# Patient Record
Sex: Female | Born: 2000 | Race: White | Hispanic: No | Marital: Single | Smoking: Former smoker
Health system: Southern US, Community
[De-identification: ages and names within clinical notes are randomized; demographics above are authoritative.]

## PROBLEM LIST (undated history)

## (undated) ENCOUNTER — Emergency Department (HOSPITAL_COMMUNITY): Payer: Medicaid Other

## (undated) ENCOUNTER — Emergency Department: Payer: Medicaid Other

## (undated) DIAGNOSIS — N200 Calculus of kidney: Secondary | ICD-10-CM

## (undated) HISTORY — PX: TONSILLECTOMY: SUR1361

---

## 2018-10-12 ENCOUNTER — Inpatient Hospital Stay (HOSPITAL_COMMUNITY)
Admission: AD | Admit: 2018-10-12 | Discharge: 2018-10-12 | Disposition: A | Discharge status: Home / Self Care | Payer: Self-pay | Source: Ambulatory Visit | Attending: Obstetrics & Gynecology | Admitting: Obstetrics & Gynecology

## 2018-10-12 ENCOUNTER — Other Ambulatory Visit: Payer: Self-pay

## 2018-10-12 ENCOUNTER — Encounter (HOSPITAL_COMMUNITY): Payer: Self-pay | Admitting: Emergency Medicine

## 2018-10-12 DIAGNOSIS — Z91030 Bee allergy status: Secondary | ICD-10-CM | POA: Insufficient documentation

## 2018-10-12 DIAGNOSIS — Z888 Allergy status to other drugs, medicaments and biological substances status: Secondary | ICD-10-CM | POA: Insufficient documentation

## 2018-10-12 DIAGNOSIS — R102 Pelvic and perineal pain: Secondary | ICD-10-CM

## 2018-10-12 DIAGNOSIS — B9689 Other specified bacterial agents as the cause of diseases classified elsewhere: Secondary | ICD-10-CM

## 2018-10-12 DIAGNOSIS — N760 Acute vaginitis: Secondary | ICD-10-CM | POA: Insufficient documentation

## 2018-10-12 DIAGNOSIS — Z87442 Personal history of urinary calculi: Secondary | ICD-10-CM | POA: Insufficient documentation

## 2018-10-12 DIAGNOSIS — F17210 Nicotine dependence, cigarettes, uncomplicated: Secondary | ICD-10-CM | POA: Insufficient documentation

## 2018-10-12 HISTORY — DX: Calculus of kidney: N20.0

## 2018-10-12 LAB — URINALYSIS, ROUTINE W REFLEX MICROSCOPIC
Bilirubin Urine: NEGATIVE
Glucose, UA: NEGATIVE mg/dL
Hgb urine dipstick: NEGATIVE
Ketones, ur: NEGATIVE mg/dL
Leukocytes, UA: NEGATIVE
Nitrite: NEGATIVE
Protein, ur: NEGATIVE mg/dL
Specific Gravity, Urine: 1.008 (ref 1.005–1.030)
pH: 5 (ref 5.0–8.0)

## 2018-10-12 LAB — CBC WITH DIFFERENTIAL/PLATELET
Basophils Absolute: 0 10*3/uL (ref 0.0–0.1)
Basophils Relative: 0 %
Eosinophils Absolute: 0.1 10*3/uL (ref 0.0–0.5)
Eosinophils Relative: 1 %
HCT: 39.9 % (ref 36.0–46.0)
Hemoglobin: 13.3 g/dL (ref 12.0–15.0)
LYMPHS ABS: 2.3 10*3/uL (ref 0.7–4.0)
Lymphocytes Relative: 29 %
MCH: 29.5 pg (ref 26.0–34.0)
MCHC: 33.3 g/dL (ref 30.0–36.0)
MCV: 88.5 fL (ref 80.0–100.0)
MONO ABS: 0.3 10*3/uL (ref 0.1–1.0)
Monocytes Relative: 3 %
Neutro Abs: 5.3 10*3/uL (ref 1.7–7.7)
Neutrophils Relative %: 67 %
Platelets: 261 10*3/uL (ref 150–400)
RBC: 4.51 MIL/uL (ref 3.87–5.11)
RDW: 12.6 % (ref 11.5–15.5)
WBC: 8 10*3/uL (ref 4.0–10.5)
nRBC: 0 % (ref 0.0–0.2)

## 2018-10-12 LAB — POCT PREGNANCY, URINE: PREG TEST UR: NEGATIVE

## 2018-10-12 LAB — WET PREP, GENITAL
Sperm: NONE SEEN
Trich, Wet Prep: NONE SEEN
WBC, Wet Prep HPF POC: NONE SEEN
Yeast Wet Prep HPF POC: NONE SEEN

## 2018-10-12 MED ORDER — NORGESTIMATE-ETH ESTRADIOL 0.25-35 MG-MCG PO TABS: 1.0000 | ORAL_TABLET | Freq: Every day | ORAL | 3 refills | Status: DC

## 2018-10-12 MED ORDER — CELECOXIB 200 MG PO CAPS: ORAL_CAPSULE | ORAL | 0 refills | Status: DC

## 2018-10-12 MED ORDER — KETOROLAC TROMETHAMINE 60 MG/2ML IM SOLN
60.0000 mg | Freq: Once | INTRAMUSCULAR | Status: AC
  Administered 2018-10-12: 60 mg via INTRAMUSCULAR
  Filled 2018-10-12: qty 2

## 2018-10-12 MED ORDER — METRONIDAZOLE 500 MG PO TABS: 500.0000 mg | ORAL_TABLET | Freq: Two times a day (BID) | ORAL | 0 refills | Status: DC

## 2018-10-12 NOTE — MAU Provider Note (Signed)
History     CSN: [id number]  Arrival date and time: 10/12/18 1223   First Provider Initiated Contact with Patient 10/12/18 1315      Chief Complaint  Patient presents with  . Abdominal Pain   HPI   [name] is a 17 y.o.female here with chronic pelvic pain. Says the pain started 1 year ago. The pain when it started was every other day or every few days and now the pain is every single day. The pain is sometimes worse when her period is going to end. This morning the pain was worse. She has not taken anything for pain today. She usually takes ibuprofen which used to help, but now it does not work. She does have regular periods. Her last period was 09/29/18. She is not on birth control or any daily medications. Has not been told what this could be and has not seen a GYN or PCP for this problem.. She had [facility] and they told her it looked like something was pushing her uterus out of the way.   OB History   None     Past Medical History:  Diagnosis Date  . Kidney stones     History reviewed. No pertinent surgical history.  Family History  Problem Relation Age of Onset  . Cancer Father   . Cancer Maternal Grandmother     Social History   Tobacco Use  . Smoking status: Current Every Day Smoker    Packs/day: 0.25    Types: Cigarettes  . Smokeless tobacco: Never Used  Substance Use Topics  . Alcohol use: Yes    Frequency: Never  . Drug use: Never    Allergies:  Allergies  Allergen Reactions  . Amoxapine And Related Anaphylaxis  . Bee Venom Hives    No medications prior to admission.   Results for orders placed or performed during the hospital encounter of 10/12/18 (from the past 48 hour(s))  Urinalysis, Routine w reflex microscopic     Status: Abnormal   Collection Time: 10/12/18 12:47 PM  Result Value Ref Range   Color, Urine STRAW (A) YELLOW   APPearance HAZY (A) CLEAR   Specific Gravity, Urine 1.008 1.005 - 1.030   pH  5.0 5.0 - 8.0   Glucose, UA NEGATIVE NEGATIVE mg/dL   Hgb urine dipstick NEGATIVE NEGATIVE   Bilirubin Urine NEGATIVE NEGATIVE   Ketones, ur NEGATIVE NEGATIVE mg/dL   Protein, ur NEGATIVE NEGATIVE mg/dL   Nitrite NEGATIVE NEGATIVE   Leukocytes, UA NEGATIVE NEGATIVE    Comment: Performed at [facility], [address] [place], [place] [id number]  Pregnancy, urine POC     Status: None   Collection Time: 10/12/18 12:52 PM  Result Value Ref Range   Preg Test, Ur NEGATIVE NEGATIVE    Comment:        THE SENSITIVITY OF THIS METHODOLOGY IS >24 mIU/mL   CBC with Differential     Status: None   Collection Time: 10/12/18  1:52 PM  Result Value Ref Range   WBC 8.0 4.0 - 10.5 K/uL   RBC 4.51 3.87 - 5.11 MIL/uL   Hemoglobin 13.3 12.0 - 15.0 g/dL   HCT 19.139.9 47.836.0 - 29.546.0 %   MCV 88.5 80.0 - 100.0 fL   MCH 29.5 26.0 - 34.0 pg   MCHC 33.3 30.0 - 36.0 g/dL   RDW 62.112.6 30.811.5 - 65.715.5 %   Platelets 261 150 - 400 K/uL   nRBC 0.0  0.0 - 0.2 %   Neutrophils Relative % 67 %   Neutro Abs 5.3 1.7 - 7.7 K/uL   Lymphocytes Relative 29 %   Lymphs Abs 2.3 0.7 - 4.0 K/uL   Monocytes Relative 3 %   Monocytes Absolute 0.3 0.1 - 1.0 K/uL   Eosinophils Relative 1 %   Eosinophils Absolute 0.1 0.0 - 0.5 K/uL   Basophils Relative 0 %   Basophils Absolute 0.0 0.0 - 0.1 K/uL    Comment: Performed at [facility], [address] [place], [place] [address]  Wet prep, genital     Status: Abnormal   Collection Time: 10/12/18  1:54 PM  Result Value Ref Range   Yeast Wet Prep HPF POC NONE SEEN NONE SEEN   Trich, Wet Prep NONE SEEN NONE SEEN   Clue Cells Wet Prep HPF POC PRESENT (A) NONE SEEN   WBC, Wet Prep HPF POC NONE SEEN NONE SEEN    Comment: MANY BACTERIA SEEN   Sperm NONE SEEN     Comment: Performed at [facility], [address] [place], [place] [address]    Review of Systems  Constitutional: Negative for fever.  Gastrointestinal: Negative for abdominal pain.  Genitourinary: Positive for  dyspareunia, pelvic pain and vaginal discharge. Negative for dysuria, flank pain, frequency and vaginal bleeding.   Physical Exam   Blood pressure 132/83, pulse 73, temperature 98 F (36.7 C), temperature source Oral, resp. rate 18, weight 75.2 kg, last menstrual period 09/29/2018, SpO2 100 %.  Physical Exam  Constitutional: She is oriented to person, place, and time. She appears well-developed and well-nourished. No distress.  HENT:  Head: Normocephalic.  Eyes: Pupils are equal, round, and reactive to light.  GI: Soft. She exhibits no distension. There is no tenderness. There is no rebound and no guarding.  Genitourinary: Vaginal discharge found.  Genitourinary Comments: Bimanual exam: Cervix closed, no CMT  + uterine tenderness, normal size Adnexa non tender, no masses bilaterally GC/Chlam, wet prep done Chaperone present for exam.   Musculoskeletal: Normal range of motion.  Neurological: She is alert and oriented to person, place, and time.  Skin: Skin is warm. She is not diaphoretic.  Psychiatric: Her behavior is normal.   MAU Course  Procedures  None  MDM  Toradol given 60 mg IM CBC, Wet prep and GC Pain level at the time of DC is 0/10  Assessment and Plan   A:  1. Pelvic pain   2. BV (bacterial vaginosis)     P:  Discharge home in stable condition  F/U with [facility] for chronic pelvic pain. Patient instructed to bring records from [facility] to her appt.  Message sent to [facility] for appointment after pelvic US Rx: Celebrex, Sprintec. Stop ibuprofen use while taking Celebrex., Flagyl ( no alcohol ) Return to MAU for emergencies.   Duane Lope, NP 10/12/2018 3:32 PM

## 2018-10-12 NOTE — Discharge Instructions (Signed)
Pelvic Pain, Female °Pelvic pain is pain in your lower belly (abdomen), below your belly button and between your hips. The pain may start suddenly (acute), keep coming back (recurring), or last a long time (chronic). Pelvic pain that lasts longer than six months is considered chronic. There are many causes of pelvic pain. Sometimes the cause of your pelvic pain is not known. °Follow these instructions at home: °· Take over-the-counter and prescription medicines only as told by your doctor. °· Rest as told by your doctor. °· Do not have sex it if hurts. °· Keep a journal of your pelvic pain. Write down: °? When the pain started. °? Where the pain is located. °? What seems to make the pain better or worse, such as food or your menstrual cycle. °? Any symptoms you have along with the pain. °· Keep all follow-up visits as told by your doctor. This is important. °Contact a doctor if: °· Medicine does not help your pain. °· Your pain comes back. °· You have new symptoms. °· You have unusual vaginal discharge or bleeding. °· You have a fever or chills. °· You are having a hard time pooping (constipation). °· You have blood in your pee (urine) or poop (stool). °· Your pee smells bad. °· You feel weak or lightheaded. °Get help right away if: °· You have sudden pain that is very bad. °· Your pain continues to get worse. °· You have very bad pain and also have any of the following symptoms: °? A fever. °? Feeling stick to your stomach (nausea). °? Throwing up (vomiting). °? Being very sweaty. °· You pass out (lose consciousness). °This information is not intended to replace advice given to you by your health care provider. Make sure you discuss any questions you have with your health care provider. °Document Released: 04/18/2008 Document Revised: 11/25/2015 Document Reviewed: 08/21/2015 °Elsevier Interactive Patient Education © 2018 [employer] Inc. ° °

## 2018-10-12 NOTE — MAU Note (Signed)
Since Jan of this year, has been having uterine pain.  [facility]..  The pain used to not be every day, has gotten worse and is an every day occurrence.  Can't sit , walk or lay down comfortably.  Has had UTI's before - this is definitely not that type of pain.

## 2018-10-13 LAB — HIV ANTIBODY (ROUTINE TESTING W REFLEX): HIV Screen 4th Generation wRfx: NONREACTIVE

## 2018-10-15 LAB — GC/CHLAMYDIA PROBE AMP (~~LOC~~) NOT AT ARMC
Chlamydia: NEGATIVE
Neisseria Gonorrhea: NEGATIVE

## 2018-10-25 ENCOUNTER — Encounter: Payer: Self-pay | Admitting: Obstetrics and Gynecology

## 2018-10-25 ENCOUNTER — Ambulatory Visit: Payer: Self-pay | Admitting: Obstetrics and Gynecology

## 2018-10-25 VITALS — BP 119/64 | HR 120 | Wt 165.0 lb

## 2018-10-25 DIAGNOSIS — R102 Pelvic and perineal pain: Secondary | ICD-10-CM

## 2018-10-25 DIAGNOSIS — G8929 Other chronic pain: Secondary | ICD-10-CM

## 2018-10-25 MED ORDER — NORGESTIMATE-ETH ESTRADIOL 0.25-35 MG-MCG PO TABS: 1.0000 | ORAL_TABLET | Freq: Every day | ORAL | 11 refills | Status: DC

## 2018-10-25 MED ORDER — NAPROXEN 500 MG PO TABS: 500.0000 mg | ORAL_TABLET | Freq: Two times a day (BID) | ORAL | 0 refills | Status: DC

## 2018-10-25 NOTE — Progress Notes (Signed)
GYNECOLOGY ANNUAL PREVENTATIVE CARE ENCOUNTER NOTE  Subjective:   Denise Fuentes is a 17 y.o. G0. female here for a routine annual gynecologic exam.  Current complaints:  Denies abnormal vaginal bleeding, discharge, pelvic pain, problems with intercourse or other gynecologic concerns.    Here today with chronic pelvic pain. She has a history of chronic UTI's. In December she started having pain in her pelvis more frequently even when she was not urinating. In February her pain worsened and would worsen when she stood or changed positions. She felt a stabbing pain in her pelvis. [facility] and had an ultrasound done there. The US showed something pushing on her uterus. She has pain during intercourse.  The pain is present all the time. It does not lessen or worsen with her menstrual cycle. She is having normal menstrual cycles. No constipation. She had a workup in MAU recently and is here today for ED follow-up. She does not have her records from [facility].   Gynecologic History Patient's last menstrual period was 09/29/2018. Contraception: OCP (estrogen/progesterone) has not started yet.  Last Pap: NA age  Last mammogram: NA   Obstetric History OB History  No obstetric history on file.    Past Medical History:  Diagnosis Date  . Kidney stones     No past surgical history on file.  Current Outpatient Medications on File Prior to Visit  Medication Sig Dispense Refill  . celecoxib (CELEBREX) 200 MG capsule Day 1 take 400 mg in the AM then take 200 mg in the evening. Resume 200 mg BID 90 capsule 0  . metroNIDAZOLE (FLAGYL) 500 MG tablet Take 1 tablet (500 mg total) by mouth 2 (two) times daily. 14 tablet 0   No current facility-administered medications on file prior to visit.     Allergies  Allergen Reactions  . Amoxapine And Related Anaphylaxis  . Bee Venom Hives    Social History:  reports that she has been smoking cigarettes. She has been smoking about 0.25 packs per day.  She has never used smokeless tobacco. She reports current alcohol use. She reports that she does not use drugs.  Family History  Problem Relation Age of Onset  . Cancer Father   . Cancer Maternal Grandmother     The following portions of the patient's history were reviewed and updated as appropriate: allergies, current medications, past family history, past medical history, past social history, past surgical history and problem list.  Review of Systems Pertinent items noted in HPI and remainder of comprehensive ROS otherwise negative.   Objective:  BP 119/64   Pulse (!) 120   Wt 165 lb (74.8 kg)   LMP 09/29/2018  CONSTITUTIONAL: Well-developed, well-nourished female in no acute distress.  HENT:  Normocephalic, atraumatic, External right and left ear normal. Oropharynx is clear and moist EYES: Conjunctivae and EOM are normal. Pupils are equal, round, and reactive to light. No scleral icterus.  NECK: Normal range of motion, supple, no masses.  Normal thyroid.  SKIN: Skin is warm and dry. No rash noted. Not diaphoretic. No erythema. No pallor. MUSCULOSKELETAL: Normal range of motion. No tenderness.  No cyanosis, clubbing, or edema.  2+ distal pulses. NEUROLOGIC: Alert and oriented to person, place, and time. Normal reflexes, muscle tone coordination. No cranial nerve deficit noted. PSYCHIATRIC: Normal mood and affect. Normal behavior. Normal judgment and thought content. CARDIOVASCULAR: Normal heart rate noted, regular rhythm RESPIRATORY: Clear to auscultation bilaterally. Effort and breath sounds normal, no problems with  respiration noted. ABDOMEN: Soft, normal bowel sounds, no distention noted.  No tenderness, rebound or guarding.   Assessment and Plan:   1. Chronic pelvic pain in female  Start BC pills today Rx: Naproxen. No other SAIDS while taking Consent for release signed today to get [place] results from [facility]. Will schedule F/u visit once [place] is received  Discussed patient with Dr.  Macon Large.  May need urology consult    [facility], [facility]  Denise Fuentes, Denise Rutherford, NP

## 2018-10-25 NOTE — Progress Notes (Deleted)
   Subjective:    Denise Fuentes - 17 y.o. female MRN [id number]  Date of birth: 18-Jan-2001  HPI  Denise PortelaSavannah Fuentes is a 17 y.o. No obstetric history on file. female here for ***     OB History   No obstetric history on file.       Health Maintenance:  Normal pap and negative HRHPV on ***.  Normal mammogram on ***.  Health Maintenance:  - *** There are no preventive care reminders to display for this patient.  -  reports that she has been smoking cigarettes. She has been smoking about 0.25 packs per day. She has never used smokeless tobacco. - Review of Systems: Per HPI. - Past Medical History: There are no active problems to display for this patient.  - Medications: reviewed and updated   Objective:   Physical Exam LMP 09/29/2018  Gen: NAD, alert, cooperative with exam, well-appearing HEENT: NCAT, PERRL, clear conjunctiva, oropharynx clear, supple neck CV: RRR, good S1/S2, no murmur, no edema, capillary refill brisk  Resp: CTABL, no wheezes, non-labored Abd: SNTND, BS present, no guarding or organomegaly Skin: no rashes, normal turgor  Neuro: no gross deficits.  Psych: good insight, alert and oriented GU/GYN: Exam performed in the presence of a chaperone. External genitalia within normal limits.  Vaginal mucosa pink, moist, normal rugae.  Nonfriable cervix without lesions, no discharge or bleeding noted on speculum exam.  Bimanual exam revealed normal, nongravid uterus.  No cervical motion tenderness. No adnexal masses bilaterally.           Assessment & Plan:   There are no diagnoses linked to this encounter.  Routine preventative health maintenance measures emphasized. Please refer to After Visit Summary for other counseling recommendations.   No follow-ups on file.  @[facility] @ OB Fellow  10/25/2018, 4:00 PM

## 2018-11-01 DIAGNOSIS — G8929 Other chronic pain: Secondary | ICD-10-CM | POA: Insufficient documentation

## 2018-11-01 DIAGNOSIS — R102 Pelvic and perineal pain: Principal | ICD-10-CM

## 2018-11-14 ENCOUNTER — Other Ambulatory Visit: Payer: Self-pay

## 2018-11-14 ENCOUNTER — Emergency Department (HOSPITAL_COMMUNITY)
Admission: EM | Admit: 2018-11-14 | Discharge: 2018-11-14 | Disposition: A | Discharge status: Home / Self Care | Payer: Self-pay | Attending: Emergency Medicine | Admitting: Emergency Medicine

## 2018-11-14 ENCOUNTER — Encounter (HOSPITAL_COMMUNITY): Payer: Self-pay | Admitting: Emergency Medicine

## 2018-11-14 DIAGNOSIS — G8929 Other chronic pain: Secondary | ICD-10-CM | POA: Insufficient documentation

## 2018-11-14 DIAGNOSIS — R52 Pain, unspecified: Secondary | ICD-10-CM | POA: Diagnosis not present

## 2018-11-14 DIAGNOSIS — I10 Essential (primary) hypertension: Secondary | ICD-10-CM | POA: Diagnosis not present

## 2018-11-14 DIAGNOSIS — R102 Pelvic and perineal pain: Secondary | ICD-10-CM | POA: Insufficient documentation

## 2018-11-14 DIAGNOSIS — F17210 Nicotine dependence, cigarettes, uncomplicated: Secondary | ICD-10-CM | POA: Insufficient documentation

## 2018-11-14 DIAGNOSIS — Z79899 Other long term (current) drug therapy: Secondary | ICD-10-CM | POA: Insufficient documentation

## 2018-11-14 LAB — COMPREHENSIVE METABOLIC PANEL
ALT: 19 U/L (ref 0–44)
AST: 28 U/L (ref 15–41)
Albumin: 4.8 g/dL (ref 3.5–5.0)
Alkaline Phosphatase: 75 U/L (ref 38–126)
Anion gap: 12 (ref 5–15)
BUN: 8 mg/dL (ref 6–20)
CO2: 22 mmol/L (ref 22–32)
Calcium: 9.4 mg/dL (ref 8.9–10.3)
Chloride: 106 mmol/L (ref 98–111)
Creatinine, Ser: 0.63 mg/dL (ref 0.44–1.00)
GFR calc Af Amer: >60 mL/min (ref >60)
GFR calc non Af Amer: >60 mL/min (ref >60)
Glucose, Bld: 92 mg/dL (ref 70–99)
Potassium: 3.4 mmol/L — ABNORMAL LOW (ref 3.5–5.1)
Sodium: 140 mmol/L (ref 135–145)
Total Bilirubin: 0.4 mg/dL (ref 0.3–1.2)
Total Protein: 8.8 g/dL — ABNORMAL HIGH (ref 6.5–8.1)

## 2018-11-14 LAB — CBC
HEMATOCRIT: 45 % (ref 36.0–46.0)
Hemoglobin: 15.3 g/dL — ABNORMAL HIGH (ref 12.0–15.0)
MCH: 29.8 pg (ref 26.0–34.0)
MCHC: 34 g/dL (ref 30.0–36.0)
MCV: 87.7 fL (ref 80.0–100.0)
Platelets: 323 10*3/uL (ref 150–400)
RBC: 5.13 MIL/uL — ABNORMAL HIGH (ref 3.87–5.11)
RDW: 12.1 % (ref 11.5–15.5)
WBC: 11.7 10*3/uL — ABNORMAL HIGH (ref 4.0–10.5)
nRBC: 0 % (ref 0.0–0.2)

## 2018-11-14 LAB — I-STAT BETA HCG BLOOD, ED (MC, WL, AP ONLY)

## 2018-11-14 LAB — URINALYSIS, ROUTINE W REFLEX MICROSCOPIC
Bilirubin Urine: NEGATIVE
Glucose, UA: NEGATIVE mg/dL
Ketones, ur: 80 mg/dL — AB
Leukocytes, UA: NEGATIVE
Nitrite: NEGATIVE
Protein, ur: 30 mg/dL — AB
Specific Gravity, Urine: 1.032 — ABNORMAL HIGH (ref 1.005–1.030)
pH: 5 (ref 5.0–8.0)

## 2018-11-14 LAB — LIPASE, BLOOD: Lipase: 26 U/L (ref 11–51)

## 2018-11-14 MED ORDER — KETOROLAC TROMETHAMINE 30 MG/ML IJ SOLN
30.0000 mg | Freq: Once | INTRAMUSCULAR | Status: AC
  Administered 2018-11-14: 30 mg via INTRAMUSCULAR
  Filled 2018-11-14: qty 1

## 2018-11-14 NOTE — ED Provider Notes (Signed)
[facility]   CSN: [id number] Arrival date & time: 11/14/18  1510     History   Chief Complaint Chief Complaint  Patient presents with  . Abdominal Pain    HPI Denise Fuentes is a 18 y.o. female.  HPI  18 yo female g0p0 presents complaining of suprapubic pain.  STates she has had this pain for 11 months.  Seen at [facility] and had [place].  Supposed to f/u with her gyn.  States pain is daily but now worse for 3 days.  Took ibuprofen and goody powder and now improved.  Some nausea but no vomiting.  Denies uti symptoms, vaginal discharge.  LMP 12/27.  RX for bcp but not taking yet. From NOvember MAU visit.    [name] is a 18 y.o.female here with chronic pelvic pain. Says the pain started 1 year ago. The pain when it started was every other day or every few days and now the pain is every single day. The pain is sometimes worse when her period is going to end. This morning the pain was worse. She has not taken anything for pain today. She usually takes ibuprofen which used to help, but now it does not work. She does have regular periods. Her last period was 09/29/18. She is not on birth control or any daily medications. Has not been told what this could be and has not seen a GYN or PCP for this problem.. She had an US done 3 months at [facility] and they told her it looked like something was pushing her uterus out of the way.     Past Medical History:  Diagnosis Date  . Kidney stones     Patient Active Problem List   Diagnosis Date Noted  . Chronic pelvic pain in female 11/01/2018    History reviewed. No pertinent surgical history.   OB History   No obstetric history on file.      Home Medications    Prior to Admission medications   Medication Sig Start Date End Date Taking? Authorizing Provider  celecoxib (CELEBREX) 200 MG capsule Day 1 take 400 mg in the AM then take 200 mg in the evening. Resume 200 mg BID 10/12/18    Rasch, Victorino Dike I, NP  metroNIDAZOLE (FLAGYL) 500 MG tablet Take 1 tablet (500 mg total) by mouth 2 (two) times daily. 10/12/18   Rasch, Victorino Dike I, NP  naproxen (NAPROSYN) 500 MG tablet Take 1 tablet (500 mg total) by mouth 2 (two) times daily with a meal. 10/25/18   Rasch, Harolyn Rutherford, NP  norgestimate-ethinyl estradiol (ORTHO-CYCLEN,SPRINTEC,PREVIFEM) 0.25-35 MG-MCG tablet Take 1 tablet by mouth daily. 10/25/18   Rasch, Harolyn Rutherford, NP    Family History Family History  Problem Relation Age of Onset  . Cancer Father   . Cancer Maternal Grandmother     Social History Social History   Tobacco Use  . Smoking status: Current Every Day Smoker    Packs/day: 0.25    Types: Cigarettes  . Smokeless tobacco: Never Used  Substance Use Topics  . Alcohol use: Yes    Frequency: Never  . Drug use: Never     Allergies   Amoxapine and related and Bee venom   Review of Systems Review of Systems  All other systems reviewed and are negative.    Physical Exam Updated Vital Signs BP (!) 135/93 (BP Location: Left Arm)   Pulse 89   Temp 98.4 F (36.9 C) (Oral)  Resp 16   Ht 1.524 m (5')   Wt 72.6 kg   LMP 11/07/2018   SpO2 100%   BMI 31.25 kg/m   Physical Exam Vitals signs and nursing note reviewed.  Constitutional:      General: She is not in acute distress.    Appearance: She is well-developed.  HENT:     Head: Normocephalic and atraumatic.     Right Ear: External ear normal.     Left Ear: External ear normal.     Nose: Nose normal.  Eyes:     Conjunctiva/sclera: Conjunctivae normal.     Pupils: Pupils are equal, round, and reactive to light.  Neck:     Musculoskeletal: Normal range of motion and neck supple.  Pulmonary:     Effort: Pulmonary effort is normal.  Abdominal:     General: Abdomen is flat. Bowel sounds are normal.     Palpations: Abdomen is soft.     Tenderness: There is no abdominal tenderness.  Genitourinary:    Uterus: Enlarged. Not tender.       Comments: Pelvic exam deferred as new no pelvic complaints Musculoskeletal: Normal range of motion.  Skin:    General: Skin is warm and dry.  Neurological:     Mental Status: She is alert and oriented to person, place, and time.     Motor: No abnormal muscle tone.     Coordination: Coordination normal.  Psychiatric:        Behavior: Behavior normal.        Thought Content: Thought content normal.      ED Treatments / Results  Labs (all labs ordered are listed, but only abnormal results are displayed) Labs Reviewed  COMPREHENSIVE METABOLIC PANEL - Abnormal; Notable for the following components:      Result Value   Potassium 3.4 (*)    Total Protein 8.8 (*)    All other components within normal limits  CBC - Abnormal; Notable for the following components:   WBC 11.7 (*)    RBC 5.13 (*)    Hemoglobin 15.3 (*)    All other components within normal limits  URINALYSIS, ROUTINE W REFLEX MICROSCOPIC - Abnormal; Notable for the following components:   APPearance CLOUDY (*)    Specific Gravity, Urine 1.032 (*)    Hgb urine dipstick SMALL (*)    Ketones, ur 80 (*)    Protein, ur 30 (*)    Bacteria, UA RARE (*)    All other components within normal limits  LIPASE, BLOOD  I-STAT BETA HCG BLOOD, ED ([facility], [facility], [facility] ONLY)    EKG None  Radiology No results found.  Procedures Procedures (including critical care time)  Medications Ordered in ED Medications  ketorolac (TORADOL) 30 MG/ML injection 30 mg (has no administration in time range)     Initial Impression / Assessment and Plan / ED Course  I have reviewed the triage vital signs and the nursing notes.  Pertinent labs & imaging results that were available during my care of the patient were reviewed by me and considered in my medical decision making (see chart for details).    18 year old female G0 P0 presents with chronic pelvic pain.  Lab work here reveals no pregnancy, no evidence of urinary tract infection, and normal  CBC.  Patient received a shot of Toradol here.  She is advised that she needs to follow-up with her gynecologist. Final Clinical Impressions(s) / ED Diagnoses   Final diagnoses:  Chronic pelvic pain in  female    ED Discharge Orders    None       Margarita Grizzle, MD 11/14/18 1924

## 2018-11-14 NOTE — ED Triage Notes (Signed)
Per EMS pt complaint of uterine pain since Feb; followed by OBGYN but this pain is worse than it has ever been; 6 hours this event.

## 2018-11-14 NOTE — Discharge Instructions (Addendum)
Please recheck with Dr. Macon Large Your labs, urine, and pregnancy test are normal here- not pregnant. Please get your birth control pills filled and begin taking

## 2018-12-27 ENCOUNTER — Telehealth: Payer: Self-pay | Admitting: *Deleted

## 2018-12-27 MED ORDER — NAPROXEN 500 MG PO TABS: 500.0000 mg | ORAL_TABLET | Freq: Two times a day (BID) | ORAL | 0 refills | Status: DC

## 2018-12-27 NOTE — Telephone Encounter (Signed)
Called pt and left message stating that I am calling in reference to a prescription refill request from her pharmacy. She may call back if she has questions. Pt can be informed that a refill for Naproxyn was authorized by Venia Carbon and has been sent to her pharmacy.

## 2019-03-15 ENCOUNTER — Emergency Department (HOSPITAL_COMMUNITY)
Admission: EM | Admit: 2019-03-15 | Discharge: 2019-03-15 | Disposition: A | Discharge status: Home / Self Care | Payer: BLUE CROSS/BLUE SHIELD | Attending: Emergency Medicine | Admitting: Emergency Medicine

## 2019-03-15 ENCOUNTER — Other Ambulatory Visit: Payer: Self-pay

## 2019-03-15 ENCOUNTER — Encounter (HOSPITAL_COMMUNITY): Payer: Self-pay

## 2019-03-15 DIAGNOSIS — R079 Chest pain, unspecified: Secondary | ICD-10-CM | POA: Diagnosis not present

## 2019-03-15 DIAGNOSIS — Z79899 Other long term (current) drug therapy: Secondary | ICD-10-CM | POA: Diagnosis not present

## 2019-03-15 DIAGNOSIS — R42 Dizziness and giddiness: Secondary | ICD-10-CM | POA: Insufficient documentation

## 2019-03-15 DIAGNOSIS — R6883 Chills (without fever): Secondary | ICD-10-CM | POA: Insufficient documentation

## 2019-03-15 DIAGNOSIS — O9989 Other specified diseases and conditions complicating pregnancy, childbirth and the puerperium: Secondary | ICD-10-CM | POA: Diagnosis not present

## 2019-03-15 DIAGNOSIS — Z3A00 Weeks of gestation of pregnancy not specified: Secondary | ICD-10-CM | POA: Insufficient documentation

## 2019-03-15 DIAGNOSIS — Z20828 Contact with and (suspected) exposure to other viral communicable diseases: Secondary | ICD-10-CM | POA: Diagnosis not present

## 2019-03-15 DIAGNOSIS — O219 Vomiting of pregnancy, unspecified: Secondary | ICD-10-CM | POA: Diagnosis not present

## 2019-03-15 DIAGNOSIS — O99331 Smoking (tobacco) complicating pregnancy, first trimester: Secondary | ICD-10-CM | POA: Diagnosis not present

## 2019-03-15 DIAGNOSIS — Z3491 Encounter for supervision of normal pregnancy, unspecified, first trimester: Secondary | ICD-10-CM

## 2019-03-15 DIAGNOSIS — R059 Cough, unspecified: Secondary | ICD-10-CM

## 2019-03-15 DIAGNOSIS — R61 Generalized hyperhidrosis: Secondary | ICD-10-CM | POA: Insufficient documentation

## 2019-03-15 DIAGNOSIS — R05 Cough: Secondary | ICD-10-CM | POA: Insufficient documentation

## 2019-03-15 DIAGNOSIS — F17210 Nicotine dependence, cigarettes, uncomplicated: Secondary | ICD-10-CM | POA: Insufficient documentation

## 2019-03-15 LAB — URINALYSIS, ROUTINE W REFLEX MICROSCOPIC
Bilirubin Urine: NEGATIVE
Glucose, UA: NEGATIVE mg/dL
Hgb urine dipstick: NEGATIVE
Ketones, ur: 80 mg/dL — AB
Leukocytes,Ua: NEGATIVE
Nitrite: NEGATIVE
Protein, ur: 30 mg/dL — AB
Specific Gravity, Urine: 1.028 (ref 1.005–1.030)
pH: 5 (ref 5.0–8.0)

## 2019-03-15 LAB — CBC
HCT: 42.9 % (ref 36.0–46.0)
Hemoglobin: 15 g/dL (ref 12.0–15.0)
MCH: 30 pg (ref 26.0–34.0)
MCHC: 35 g/dL (ref 30.0–36.0)
MCV: 85.8 fL (ref 80.0–100.0)
Platelets: 349 10*3/uL (ref 150–400)
RBC: 5 MIL/uL (ref 3.87–5.11)
RDW: 11.8 % (ref 11.5–15.5)
WBC: 12.9 10*3/uL — ABNORMAL HIGH (ref 4.0–10.5)
nRBC: 0 % (ref 0.0–0.2)

## 2019-03-15 LAB — COMPREHENSIVE METABOLIC PANEL
ALT: 15 U/L (ref 0–44)
AST: 20 U/L (ref 15–41)
Albumin: 5.2 g/dL — ABNORMAL HIGH (ref 3.5–5.0)
Alkaline Phosphatase: 73 U/L (ref 38–126)
Anion gap: 13 (ref 5–15)
BUN: 9 mg/dL (ref 6–20)
CO2: 19 mmol/L — ABNORMAL LOW (ref 22–32)
Calcium: 9.8 mg/dL (ref 8.9–10.3)
Chloride: 103 mmol/L (ref 98–111)
Creatinine, Ser: 0.57 mg/dL (ref 0.44–1.00)
GFR calc Af Amer: >60 mL/min (ref >60)
GFR calc non Af Amer: >60 mL/min (ref >60)
Glucose, Bld: 95 mg/dL (ref 70–99)
Potassium: 3.2 mmol/L — ABNORMAL LOW (ref 3.5–5.1)
Sodium: 135 mmol/L (ref 135–145)
Total Bilirubin: 1.2 mg/dL (ref 0.3–1.2)
Total Protein: 9.3 g/dL — ABNORMAL HIGH (ref 6.5–8.1)

## 2019-03-15 LAB — LIPASE, BLOOD: Lipase: 23 U/L (ref 11–51)

## 2019-03-15 LAB — I-STAT BETA HCG BLOOD, ED (MC, WL, AP ONLY): I-stat hCG, quantitative: >2000 m[IU]/mL — ABNORMAL HIGH (ref <5)

## 2019-03-15 MED ORDER — SODIUM CHLORIDE 0.9 % IV BOLUS
1000.0000 mL | Freq: Once | INTRAVENOUS | Status: AC
  Administered 2019-03-15: 18:00:00 1000 mL via INTRAVENOUS

## 2019-03-15 MED ORDER — ONDANSETRON 4 MG PO TBDP: 4.0000 mg | ORAL_TABLET | Freq: Three times a day (TID) | ORAL | 0 refills | Status: DC | PRN

## 2019-03-15 MED ORDER — SODIUM CHLORIDE 0.9% FLUSH: 3.0000 mL | Freq: Once | INTRAVENOUS | Status: DC

## 2019-03-15 MED ORDER — ONDANSETRON 4 MG PO TBDP
4.0000 mg | ORAL_TABLET | Freq: Once | ORAL | Status: AC | PRN
  Administered 2019-03-15: 4 mg via ORAL
  Filled 2019-03-15: qty 1

## 2019-03-15 NOTE — ED Notes (Signed)
Patient offered PO fluids.

## 2019-03-15 NOTE — ED Triage Notes (Signed)
Patient c/o cough, SOB, vomiting, abdominal pain and fever x 2 days.

## 2019-03-15 NOTE — Discharge Instructions (Addendum)
You were seen in the ED for cough, nausea and vomiting.  You are pregnant, this is likely contributing and causing your nausea and vomiting.  Stay well-hydrated.  To prevent nausea and vomiting in pregnancy you can take over-the-counter B6 25 mg every 8 hours and doxylamine 1 tab at night.  For breakthrough vomiting, you can use ondansetron (Zofran) which was prescribed.   Cough is likely from a virus.  Given current outbreak, we will manage symptoms under presumed COVID-19 (coronavirus) infection.  It is also possible you could have other viral upper respiratory infection.  There was no medical necessity for formal testing.  Testing would not have changed our management.  Management will include self-isolation, monitoring of symptoms and supportive care with over-the-counter medicines.    Return to the ED if there is increased work of breathing, shortness of breath, inability to tolerate fluids, weakness, chest pain.  The Department of Public Health is recommending the following isolation recommendations.  Track your fever. All 3 of the following must be met to complete insolation period:   At least 7 days since symptom onset 72 hours of absence of fever without antifever medicine (ibuprofen, acetaminophen). A fever is temperature of 100.22F or greater. Improvement of respiratory symptoms   Stay well-hydrated. Rest. You can use over the counter medications to help with symptoms: Acetaminophen (tylenol) every 6 hours, around the clock to help with associated fevers, sore throat, headaches, generalized body aches and malaise.  Oxymetazoline (afrin) intranasal spray once daily for no more than 3 days to help with congestion, after 3 days you can switch to another over-the-counter nasal steroid spray such as fluticasone (flonase) Allergy medication (loratadine, cetirizine, etc) and phenylephrine (sudafed) help with nasal congestion, runny nose and postnasal drip.   Dextromethorphan (Delsym) to suppress  dry cough. Frequent coughing is likely causing your chest wall pain Wash your hands often to prevent spread.    Infection Prevention Recommendations for Individuals Confirmed to have, or Being Evaluated for, or have symptoms of 2019 Novel Coronavirus (COVID-19) Infection Who Receive Care at Home  Individuals who are confirmed to have, or are being evaluated for, COVID-19 should follow the prevention steps below until a healthcare provider or local or state health department says they can return to normal activities.  Stay home except to get medical care You should restrict activities outside your home, except for getting medical care. Do not go to work, school, or public areas, and do not use public transportation or taxis.  Call ahead before visiting your doctor Before your medical appointment, call the healthcare provider and tell them that you have, or are being evaluated for, COVID-19 infection. This will help the healthcare providers office take steps to keep other people from getting infected. Ask your healthcare provider to call the local or state health department.  Monitor your symptoms Seek prompt medical attention if your illness is worsening (e.g., difficulty breathing). Before going to your medical appointment, call the healthcare provider and tell them that you have, or are being evaluated for, COVID-19 infection. Ask your healthcare provider to call the local or state health department.  Wear a facemask You should wear a facemask that covers your nose and mouth when you are in the same room with other people and when you visit a healthcare provider. People who live with or visit you should also wear a facemask while they are in the same room with you.  Separate yourself from other people in your home As much as possible,  you should stay in a different room from other people in your home. Also, you should use a separate bathroom, if available.  Avoid sharing household  items You should not share dishes, drinking glasses, cups, eating utensils, towels, bedding, or other items with other people in your home. After using these items, you should wash them thoroughly with soap and water.  Cover your coughs and sneezes Cover your mouth and nose with a tissue when you cough or sneeze, or you can cough or sneeze into your sleeve. Throw used tissues in a lined trash can, and immediately wash your hands with soap and water for at least 20 seconds or use an alcohol-based hand rub.  Wash your [facility] your hands often and thoroughly with soap and water for at least 20 seconds. You can use an alcohol-based hand sanitizer if soap and water are not available and if your hands are not visibly dirty. Avoid touching your eyes, nose, and mouth with unwashed hands.   Prevention Steps for Caregivers and Household Members of Individuals Confirmed to have, or Being Evaluated for, or have symptoms of 2019 Novel Coronavirus (COVID-19) Infection Being Cared for in the Home  If you live with, or provide care at home for, a person confirmed to have, or being evaluated for, COVID-19 infection please follow these guidelines to prevent infection:  Follow healthcare providers instructions Make sure that you understand and can help the patient follow any healthcare provider instructions for all care.  Provide for the patients basic needs You should help the patient with basic needs in the home and provide support for getting groceries, prescriptions, and other personal needs.  Monitor the patients symptoms If they are getting sicker, call his or her medical provider and tell them that the patient has, or is being evaluated for, COVID-19 infection. This will help the healthcare providers office take steps to keep other people from getting infected. Ask the healthcare provider to call the local or state health department.  Limit the number of people who have contact with the  patient If possible, have only one caregiver for the patient. Other household members should stay in another home or place of residence. If this is not possible, they should stay in another room, or be separated from the patient as much as possible. Use a separate bathroom, if available. Restrict visitors who do not have an essential need to be in the home.  Keep older adults, very young children, and other sick people away from the patient Keep older adults, very young children, and those who have compromised immune systems or chronic health conditions away from the patient. This includes people with chronic heart, lung, or kidney conditions, diabetes, and cancer.  Ensure good ventilation Make sure that shared spaces in the home have good air flow, such as from an air conditioner or an opened window, weather permitting.  Wash your hands often Wash your hands often and thoroughly with soap and water for at least 20 seconds. You can use an alcohol based hand sanitizer if soap and water are not available and if your hands are not visibly dirty. Avoid touching your eyes, nose, and mouth with unwashed hands. Use disposable paper towels to dry your hands. If not available, use dedicated cloth towels and replace them when they become wet.  Wear a facemask and gloves Wear a disposable facemask at all times in the room and gloves when you touch or have contact with the patients blood, body fluids, and/or secretions  or excretions, such as sweat, saliva, sputum, nasal mucus, vomit, urine, or feces.  Ensure the mask fits over your nose and mouth tightly, and do not touch it during use. Throw out disposable facemasks and gloves after using them. Do not reuse. Wash your hands immediately after removing your facemask and gloves. If your personal clothing becomes contaminated, carefully remove clothing and launder. Wash your hands after handling contaminated clothing. Place all used disposable facemasks,  gloves, and other waste in a lined container before disposing them with other household waste. Remove gloves and wash your hands immediately after handling these items.  Do not share dishes, glasses, or other household items with the patient Avoid sharing household items. You should not share dishes, drinking glasses, cups, eating utensils, towels, bedding, or other items with a patient who is confirmed to have, or being evaluated for, COVID-19 infection. After the person uses these items, you should wash them thoroughly with soap and water.  Wash laundry thoroughly Immediately remove and wash clothes or bedding that have blood, body fluids, and/or secretions or excretions, such as sweat, saliva, sputum, nasal mucus, vomit, urine, or feces, on them. Wear gloves when handling laundry from the patient. Read and follow directions on labels of laundry or clothing items and detergent. In general, wash and dry with the warmest temperatures recommended on the label.  Clean all areas the individual has used often Clean all touchable surfaces, such as counters, tabletops, doorknobs, bathroom fixtures, toilets, phones, keyboards, tablets, and bedside tables, every day. Also, clean any surfaces that may have blood, body fluids, and/or secretions or excretions on them. Wear gloves when cleaning surfaces the patient has come in contact with. Use a diluted bleach solution (e.g., dilute bleach with 1 part bleach and 10 parts water) or a household disinfectant with a label that says EPA-registered for coronaviruses. To make a bleach solution at home, add 1 tablespoon of bleach to 1 quart (4 cups) of water. For a larger supply, add  cup of bleach to 1 gallon (16 cups) of water. Read labels of cleaning products and follow recommendations provided on product labels. Labels contain instructions for safe and effective use of the cleaning product including precautions you should take when applying the product, such as  wearing gloves or eye protection and making sure you have good ventilation during use of the product. Remove gloves and wash hands immediately after cleaning.  Monitor yourself for signs and symptoms of illness Caregivers and household members are considered close contacts, should monitor their health, and will be asked to limit movement outside of the home to the extent possible. Follow the monitoring steps for close contacts listed on the symptom monitoring form.  ? If you have additional questions, contact your local health department or call the epidemiologist on call at [phone] (available 24/7). ? This guidance is subject to change. For the most up-to-date guidance from [facility], please refer to their website: [email or web address]

## 2019-03-15 NOTE — ED Provider Notes (Signed)
[facility]-EMERGENCY DEPT Provider Note   CSN: [id number] Arrival date & time: 03/15/19  1501    History   Chief Complaint Chief Complaint  Patient presents with  . Emesis  . Cough  . Fever  . Abdominal Pain    HPI Denise Fuentes is a 18 y.o. female is here for evaluation of malaise onset 2 days ago associated with nausea, nonbilious nonbloody emesis, dry heaving, chills, dry cough, hot sweats, lightheadedness.  Is having pain in her chest with forceful coughing episodes.  She breaks out in a hot sweat when coughing and vomiting.  Has not been able to keep food or fluids down in last 28 hours.  She tried to take cough medicine but vomited right after. No fever.  She got Zofran in triage and feels a lot better.  LMP 4/1.  She did not know she was pregnant up until today, she denies any pelvic pain, vaginal bleeding. No associated sore throat, congestion, exertional chest pain or shortness of breath, diarrhea, dysuria, hematuria.  Has had intermittent cramping to abdomen but none right now.  Unknown fever.  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  No known sick contacts.  No travel.  No exposure to suspected or confirmed COVID-19.     Denise Fuentes was evaluated in Emergency Department on 03/15/2019 for the symptoms described in the history of present illness. She was evaluated in the context of the global COVID-19 pandemic, which necessitated consideration that the patient might be at risk for infection with the SARS-CoV-2 virus that causes COVID-19. Institutional protocols and algorithms that pertain to the evaluation of patients at risk for COVID-19 are in a state of rapid change based on information released by regulatory bodies including the CDC and federal and state organizations. These policies and algorithms were followed during the patient's care in the ED. HPI  Past Medical History:  Diagnosis Date  . Kidney stones     Patient  Active Problem List   Diagnosis Date Noted  . Chronic pelvic pain in female 11/01/2018    History reviewed. No pertinent surgical history.   OB History   No obstetric history on file.      Home Medications    Prior to Admission medications   Medication Sig Start Date End Date Taking? Authorizing Provider  celecoxib (CELEBREX) 200 MG capsule Day 1 take 400 mg in the AM then take 200 mg in the evening. Resume 200 mg BID Patient not taking: Reported on 03/15/2019 10/12/18   Rasch, Victorino Dike I, NP  metroNIDAZOLE (FLAGYL) 500 MG tablet Take 1 tablet (500 mg total) by mouth 2 (two) times daily. Patient not taking: Reported on 11/14/2018 10/12/18   Rasch, Victorino Dike I, NP  naproxen (NAPROSYN) 500 MG tablet Take 1 tablet (500 mg total) by mouth 2 (two) times daily with a meal. Patient not taking: Reported on 03/15/2019 12/27/18   Rasch, Victorino Dike I, NP  norgestimate-ethinyl estradiol (ORTHO-CYCLEN,SPRINTEC,PREVIFEM) 0.25-35 MG-MCG tablet Take 1 tablet by mouth daily. Patient not taking: Reported on 03/15/2019 10/25/18   Rasch, Victorino Dike I, NP  ondansetron (ZOFRAN ODT) 4 MG disintegrating tablet Take 1 tablet (4 mg total) by mouth every 8 (eight) hours as needed for nausea or vomiting. 03/15/19   Liberty Handy, PA-C    Family History Family History  Problem Relation Age of Onset  . Cancer Father   . Cancer Maternal Grandmother   . Healthy Mother  Social History Social History   Tobacco Use  . Smoking status: Current Every Day Smoker    Packs/day: 0.25    Types: Cigarettes  . Smokeless tobacco: Never Used  Substance Use Topics  . Alcohol use: Yes    Frequency: Never  . Drug use: Never     Allergies   Amoxapine and related and Bee venom   Review of Systems Review of Systems  Constitutional: Positive for chills and diaphoresis.  Respiratory: Positive for cough.   Gastrointestinal: Positive for abdominal pain (resolved), nausea and vomiting.  Neurological: Positive for  light-headedness.  All other systems reviewed and are negative.    Physical Exam Updated Vital Signs BP 117/82   Pulse 84   Temp 99 F (37.2 C) (Oral)   Resp 20   Ht 5' (1.524 m)   Wt 74.8 kg   LMP 02/13/2019   SpO2 100%   BMI 32.22 kg/m   Physical Exam Vitals signs and nursing note reviewed.  Constitutional:      Appearance: She is well-developed.     Comments: Non toxic in NAD  HENT:     Head: Normocephalic and atraumatic.     Nose: Nose normal.  Eyes:     Conjunctiva/sclera: Conjunctivae normal.  Neck:     Musculoskeletal: Normal range of motion.  Cardiovascular:     Rate and Rhythm: Normal rate and regular rhythm.  Pulmonary:     Effort: Pulmonary effort is normal.     Breath sounds: Normal breath sounds.  Abdominal:     General: Bowel sounds are normal.     Palpations: Abdomen is soft.     Tenderness: There is no abdominal tenderness.     Comments: No G/R/R. No suprapubic or CVA tenderness. Negative Murphy's and McBurney's. Active BS to lower quadrants.   Musculoskeletal: Normal range of motion.  Skin:    General: Skin is warm and dry.     Capillary Refill: Capillary refill takes less than 2 seconds.  Neurological:     Mental Status: She is alert.  Psychiatric:        Behavior: Behavior normal.      ED Treatments / Results  Labs (all labs ordered are listed, but only abnormal results are displayed) Labs Reviewed  COMPREHENSIVE METABOLIC PANEL - Abnormal; Notable for the following components:      Result Value   Potassium 3.2 (*)    CO2 19 (*)    Total Protein 9.3 (*)    Albumin 5.2 (*)    All other components within normal limits  CBC - Abnormal; Notable for the following components:   WBC 12.9 (*)    All other components within normal limits  URINALYSIS, ROUTINE W REFLEX MICROSCOPIC - Abnormal; Notable for the following components:   APPearance HAZY (*)    Ketones, ur 80 (*)    Protein, ur 30 (*)    Bacteria, UA RARE (*)    All other  components within normal limits  I-STAT BETA HCG BLOOD, ED ([facility], [facility], [facility] ONLY) - Abnormal; Notable for the following components:   I-stat hCG, quantitative >2,000.0 (*)    All other components within normal limits  LIPASE, BLOOD    EKG None  Radiology No results found.  Procedures Procedures (including critical care time)  Medications Ordered in ED Medications  sodium chloride flush (NS) 0.9 % injection 3 mL (has no administration in time range)  ondansetron (ZOFRAN-ODT) disintegrating tablet 4 mg (4 mg Oral Given 03/15/19 1543)  sodium chloride 0.9 % bolus 1,000 mL (0 mLs Intravenous Stopped 03/15/19 1933)     Initial Impression / Assessment and Plan / ED Course  I have reviewed the triage vital signs and the nursing notes.  Pertinent labs & imaging results that were available during my care of the patient were reviewed by me and considered in my medical decision making (see chart for details).  Clinical Course as of Mar 15 2051  Fri Mar 15, 2019  1721 WBC(!): 12.9 [CG]  1721 Potassium(!): 3.2 [CG]  1721 I-stat hCG, quantitative(!): >2,000.0 [CG]  1721 Ketones, ur(!): 80 [CG]  1721 Bacteuria in setting of pregnancy, no symptoms   Bacteria, UA(!): RARE [CG]  1731 LMP 4/1 - no vaginal bleeding since    [CG]    Clinical Course User Index [CG] Liberty HandyGibbons, Claudia J, PA-C      Suspect nausea, vomiting may be from pregnancy or viral process, or both.  Cough likely from viral process, possibly COVID-19 although no exposure or travel.  Pulmonary and abdominal exams benign. Doubt acute surgical abdomen process, cholecystitis, appendicitis, SBO, diverticulitis.  Patient did not know she was pregnant until today but denies any abdominal/pelvic pain, vaginal bleeding, urinary symptoms.  She has asymptomatic bacteriuria and will treat with Macrobid for this. Her URI symptoms are mild and exam is also benign.  Doubt bacterial bronchitis or PNA, no indication for CXR here.  She has no  comorbidities that would put her at risk for complications of viral/COVID URI, very early first trimester pregnancy has not been proven to be a risk for complication at this time. I discussed her low risk profile and will defer formal testing today, pt was comfortable with this. Plan for IVF, PO challenge, reassess.   1825: Work up as above. Ketonuria likely from dehydration. Leukocytosis likely from dehydration or acute phase reaction, afebrile, no tachycardia.   IVF running.  Final Clinical Impressions(s) / ED Diagnoses   2049: Tolerated PO. Discussed POC including supportive care and isolation for cough, B5/doxylamine to prevent pregnancy associated n/v and zofran odt for break through, oral hydration, prenatal vitamins and OBGYN f/u. Return precautions given. Pt comfortable with this plan.  Final diagnoses:  First trimester pregnancy  Nausea and vomiting during pregnancy  Cough    ED Discharge Orders         Ordered    ondansetron (ZOFRAN ODT) 4 MG disintegrating tablet  Every 8 hours PRN     03/15/19 1925           Jerrell MylarGibbons, Claudia J, PA-C 03/15/19 2052    Pricilla LovelessGoldston, Scott, MD 03/15/19 2249

## 2019-03-25 ENCOUNTER — Other Ambulatory Visit: Payer: Self-pay

## 2019-03-25 ENCOUNTER — Emergency Department (HOSPITAL_COMMUNITY)
Admission: EM | Admit: 2019-03-25 | Discharge: 2019-03-25 | Disposition: A | Discharge status: Home / Self Care | Payer: BLUE CROSS/BLUE SHIELD | Attending: Emergency Medicine | Admitting: Emergency Medicine

## 2019-03-25 ENCOUNTER — Encounter (HOSPITAL_COMMUNITY): Payer: Self-pay | Admitting: Emergency Medicine

## 2019-03-25 ENCOUNTER — Emergency Department (HOSPITAL_COMMUNITY): Payer: BLUE CROSS/BLUE SHIELD

## 2019-03-25 DIAGNOSIS — Z79899 Other long term (current) drug therapy: Secondary | ICD-10-CM | POA: Insufficient documentation

## 2019-03-25 DIAGNOSIS — O99611 Diseases of the digestive system complicating pregnancy, first trimester: Secondary | ICD-10-CM | POA: Diagnosis not present

## 2019-03-25 DIAGNOSIS — E876 Hypokalemia: Secondary | ICD-10-CM | POA: Diagnosis not present

## 2019-03-25 DIAGNOSIS — E860 Dehydration: Secondary | ICD-10-CM

## 2019-03-25 DIAGNOSIS — R05 Cough: Secondary | ICD-10-CM | POA: Diagnosis not present

## 2019-03-25 DIAGNOSIS — F17210 Nicotine dependence, cigarettes, uncomplicated: Secondary | ICD-10-CM | POA: Insufficient documentation

## 2019-03-25 DIAGNOSIS — R1110 Vomiting, unspecified: Secondary | ICD-10-CM | POA: Diagnosis not present

## 2019-03-25 DIAGNOSIS — Z3A01 Less than 8 weeks gestation of pregnancy: Secondary | ICD-10-CM | POA: Diagnosis not present

## 2019-03-25 DIAGNOSIS — O211 Hyperemesis gravidarum with metabolic disturbance: Secondary | ICD-10-CM | POA: Diagnosis not present

## 2019-03-25 DIAGNOSIS — R079 Chest pain, unspecified: Secondary | ICD-10-CM | POA: Diagnosis not present

## 2019-03-25 DIAGNOSIS — J029 Acute pharyngitis, unspecified: Secondary | ICD-10-CM | POA: Insufficient documentation

## 2019-03-25 DIAGNOSIS — R0602 Shortness of breath: Secondary | ICD-10-CM | POA: Diagnosis not present

## 2019-03-25 DIAGNOSIS — O9989 Other specified diseases and conditions complicating pregnancy, childbirth and the puerperium: Secondary | ICD-10-CM | POA: Diagnosis not present

## 2019-03-25 DIAGNOSIS — O99331 Smoking (tobacco) complicating pregnancy, first trimester: Secondary | ICD-10-CM | POA: Diagnosis not present

## 2019-03-25 DIAGNOSIS — O219 Vomiting of pregnancy, unspecified: Secondary | ICD-10-CM | POA: Diagnosis not present

## 2019-03-25 DIAGNOSIS — O210 Mild hyperemesis gravidarum: Secondary | ICD-10-CM

## 2019-03-25 LAB — CBC WITH DIFFERENTIAL/PLATELET
Abs Immature Granulocytes: 0.07 10*3/uL (ref 0.00–0.07)
Basophils Absolute: 0 10*3/uL (ref 0.0–0.1)
Basophils Relative: 0 %
Eosinophils Absolute: 0 10*3/uL (ref 0.0–0.5)
Eosinophils Relative: 0 %
HCT: 44.8 % (ref 36.0–46.0)
Hemoglobin: 15.4 g/dL — ABNORMAL HIGH (ref 12.0–15.0)
Immature Granulocytes: 0 %
Lymphocytes Relative: 6 %
Lymphs Abs: 1.2 10*3/uL (ref 0.7–4.0)
MCH: 28.9 pg (ref 26.0–34.0)
MCHC: 34.4 g/dL (ref 30.0–36.0)
MCV: 84.1 fL (ref 80.0–100.0)
Monocytes Absolute: 0.4 10*3/uL (ref 0.1–1.0)
Monocytes Relative: 2 %
Neutro Abs: 17.3 10*3/uL — ABNORMAL HIGH (ref 1.7–7.7)
Neutrophils Relative %: 92 %
Platelets: 385 10*3/uL (ref 150–400)
RBC: 5.33 MIL/uL — ABNORMAL HIGH (ref 3.87–5.11)
RDW: 11.8 % (ref 11.5–15.5)
WBC: 18.9 10*3/uL — ABNORMAL HIGH (ref 4.0–10.5)
nRBC: 0 % (ref 0.0–0.2)

## 2019-03-25 LAB — URINALYSIS, ROUTINE W REFLEX MICROSCOPIC
Bilirubin Urine: NEGATIVE
Glucose, UA: NEGATIVE mg/dL
Hgb urine dipstick: NEGATIVE
Ketones, ur: 80 mg/dL — AB
Leukocytes,Ua: NEGATIVE
Nitrite: NEGATIVE
Protein, ur: 100 mg/dL — AB
Specific Gravity, Urine: 1.029 (ref 1.005–1.030)
pH: 5 (ref 5.0–8.0)

## 2019-03-25 LAB — COMPREHENSIVE METABOLIC PANEL
ALT: 20 U/L (ref 0–44)
AST: 26 U/L (ref 15–41)
Albumin: 5.1 g/dL — ABNORMAL HIGH (ref 3.5–5.0)
Alkaline Phosphatase: 70 U/L (ref 38–126)
Anion gap: 18 — ABNORMAL HIGH (ref 5–15)
BUN: 11 mg/dL (ref 6–20)
CO2: 17 mmol/L — ABNORMAL LOW (ref 22–32)
Calcium: 9.9 mg/dL (ref 8.9–10.3)
Chloride: 101 mmol/L (ref 98–111)
Creatinine, Ser: 0.66 mg/dL (ref 0.44–1.00)
GFR calc Af Amer: >60 mL/min (ref >60)
GFR calc non Af Amer: >60 mL/min (ref >60)
Glucose, Bld: 161 mg/dL — ABNORMAL HIGH (ref 70–99)
Potassium: 3.2 mmol/L — ABNORMAL LOW (ref 3.5–5.1)
Sodium: 136 mmol/L (ref 135–145)
Total Bilirubin: 0.5 mg/dL (ref 0.3–1.2)
Total Protein: 9.2 g/dL — ABNORMAL HIGH (ref 6.5–8.1)

## 2019-03-25 LAB — LACTIC ACID, PLASMA
Lactic Acid, Venous: 2.5 mmol/L (ref 0.5–1.9)
Lactic Acid, Venous: 1.5 mmol/L (ref 0.5–1.9)

## 2019-03-25 MED ORDER — DOXYLAMINE-PYRIDOXINE 10-10 MG PO TBEC: 1.0000 | DELAYED_RELEASE_TABLET | Freq: Every day | ORAL | 0 refills | Status: DC

## 2019-03-25 MED ORDER — LACTATED RINGERS IV BOLUS
1000.0000 mL | Freq: Once | INTRAVENOUS | Status: AC
  Administered 2019-03-25: 1000 mL via INTRAVENOUS

## 2019-03-25 MED ORDER — POTASSIUM CHLORIDE CRYS ER 20 MEQ PO TBCR: 20.0000 meq | EXTENDED_RELEASE_TABLET | Freq: Every day | ORAL | 0 refills | Status: DC

## 2019-03-25 MED ORDER — POTASSIUM CHLORIDE CRYS ER 20 MEQ PO TBCR
40.0000 meq | EXTENDED_RELEASE_TABLET | Freq: Once | ORAL | Status: AC
  Administered 2019-03-25: 40 meq via ORAL
  Filled 2019-03-25: qty 2

## 2019-03-25 MED ORDER — METOCLOPRAMIDE HCL 10 MG PO TABS: 10.0000 mg | ORAL_TABLET | Freq: Three times a day (TID) | ORAL | 0 refills | Status: DC | PRN

## 2019-03-25 MED ORDER — METOCLOPRAMIDE HCL 5 MG/ML IJ SOLN
10.0000 mg | Freq: Once | INTRAMUSCULAR | Status: AC
  Administered 2019-03-25: 10:00:00 10 mg via INTRAVENOUS
  Filled 2019-03-25: qty 2

## 2019-03-25 NOTE — ED Notes (Signed)
Date and time results received: 03/25/19 11:48 AM   Test: lactic acid Critical Value: 2.5  Name of Provider Notified: Criss Alvine MD  Orders Received? Or Actions Taken?: acknowledges order

## 2019-03-25 NOTE — ED Triage Notes (Signed)
Pt reports she was here the other week and found out she was pregnant. reports been vomiting ever since esp after running out of Zofran that she was prescribed here. C/o her throat hurts really bad from all the vomiting.

## 2019-03-25 NOTE — Discharge Instructions (Signed)
If you develop fever, burning when you urinate or other discomfort when urinating, continued or worsening vomiting, vomiting blood, abdominal pain, vaginal bleeding, or any other new/concerning symptoms then return to the ER for evaluation.

## 2019-03-25 NOTE — ED Provider Notes (Signed)
[facility]-EMERGENCY DEPT Provider Note   CSN: [id number] Arrival date & time: 03/25/19  [id number]    History   Chief Complaint Chief Complaint  Patient presents with   Emesis During Pregnancy   Sore Throat    HPI Denise Fuentes is a 18 y.o. female.     HPI  18 year old female presents with vomiting.  She is been vomiting for over 1 week.  When she was here on 5/1, she was found to be pregnant.  This is her first pregnancy.  She estimates she is about [redacted] weeks pregnant, LMP in March.  She has been having numerous episodes of vomiting despite being prescribed Zofran.  She has run out of the Zofran.  She states she feels hot when she is vomiting but otherwise no fever.  She has had some coughing.  Her throat is now very sore from the amount of vomiting.  She denies any abdominal pain, vaginal bleeding, or urinary symptoms.  She does have some chest pain.  Past Medical History:  Diagnosis Date   Kidney stones     Patient Active Problem List   Diagnosis Date Noted   Chronic pelvic pain in female 11/01/2018    History reviewed. No pertinent surgical history.   OB History    Gravida  1   Para      Term      Preterm      AB      Living        SAB      TAB      Ectopic      Multiple      Live Births               Home Medications    Prior to Admission medications   Medication Sig Start Date End Date Taking? Authorizing Provider  Doxylamine-Pyridoxine 10-10 MG TBEC Take 1 tablet by mouth at bedtime. 03/25/19   Pricilla LovelessGoldston, Teiara Baria, MD  metoCLOPramide (REGLAN) 10 MG tablet Take 1 tablet (10 mg total) by mouth every 8 (eight) hours as needed for nausea (nausea/headache). 03/25/19   Pricilla LovelessGoldston, Cinque Begley, MD  norgestimate-ethinyl estradiol (ORTHO-CYCLEN,SPRINTEC,PREVIFEM) 0.25-35 MG-MCG tablet Take 1 tablet by mouth daily. Patient not taking: Reported on 03/15/2019 10/25/18   Rasch, Victorino DikeJennifer I, NP  potassium chloride SA (K-DUR) 20 MEQ tablet Take 1  tablet (20 mEq total) by mouth daily for 5 days. 03/25/19 03/30/19  Pricilla LovelessGoldston, Carianna Lague, MD    Family History Family History  Problem Relation Age of Onset   Cancer Father    Cancer Maternal Grandmother    Healthy Mother     Social History Social History   Tobacco Use   Smoking status: Current Every Day Smoker    Packs/day: 0.25    Types: Cigarettes   Smokeless tobacco: Never Used  Substance Use Topics   Alcohol use: Yes    Frequency: Never   Drug use: Never     Allergies   Amoxapine and related and Bee venom   Review of Systems Review of Systems  Constitutional: Negative for fever.  Respiratory: Positive for cough and shortness of breath.   Cardiovascular: Positive for chest pain.  Gastrointestinal: Positive for nausea and vomiting. Negative for abdominal pain.  Genitourinary: Negative for dysuria and vaginal bleeding.  All other systems reviewed and are negative.    Physical Exam Updated Vital Signs BP 104/78 (BP Location: Right Arm)    Pulse 77    Temp 98.7 F (37.1 C) (  Oral)    Resp 18    LMP 02/13/2019    SpO2 99%   Physical Exam Vitals signs and nursing note reviewed.  Constitutional:      Appearance: She is well-developed. She is not ill-appearing or diaphoretic.     Comments: Tearful  HENT:     Head: Normocephalic and atraumatic.     Right Ear: External ear normal.     Left Ear: External ear normal.     Nose: Nose normal.     Mouth/Throat:     Pharynx: Uvula midline. No oropharyngeal exudate or uvula swelling.  Eyes:     General:        Right eye: No discharge.        Left eye: No discharge.  Cardiovascular:     Rate and Rhythm: Regular rhythm. Tachycardia present.     Heart sounds: Normal heart sounds.  Pulmonary:     Effort: Pulmonary effort is normal.     Breath sounds: Normal breath sounds.  Abdominal:     General: There is no distension.     Palpations: Abdomen is soft.     Tenderness: There is no abdominal tenderness.  Skin:     General: Skin is warm and dry.  Neurological:     Mental Status: She is alert.  Psychiatric:        Mood and Affect: Mood is not anxious.      ED Treatments / Results  Labs (all labs ordered are listed, but only abnormal results are displayed) Labs Reviewed  COMPREHENSIVE METABOLIC PANEL - Abnormal; Notable for the following components:      Result Value   Potassium 3.2 (*)    CO2 17 (*)    Glucose, Bld 161 (*)    Total Protein 9.2 (*)    Albumin 5.1 (*)    Anion gap 18 (*)    All other components within normal limits  CBC WITH DIFFERENTIAL/PLATELET - Abnormal; Notable for the following components:   WBC 18.9 (*)    RBC 5.33 (*)    Hemoglobin 15.4 (*)    Neutro Abs 17.3 (*)    All other components within normal limits  URINALYSIS, ROUTINE W REFLEX MICROSCOPIC - Abnormal; Notable for the following components:   APPearance HAZY (*)    Ketones, ur 80 (*)    Protein, ur 100 (*)    Bacteria, UA RARE (*)    All other components within normal limits  LACTIC ACID, PLASMA - Abnormal; Notable for the following components:   Lactic Acid, Venous 2.5 (*)    All other components within normal limits  LACTIC ACID, PLASMA    EKG EKG Interpretation  Date/Time:  Monday Mar 25 2019 10:03:56 EDT Ventricular Rate:  104 PR Interval:    QRS Duration: 99 QT Interval:  360 QTC Calculation: 474 R Axis:   96 Text Interpretation:  Sinus tachycardia Borderline right axis deviation No old tracing to compare Confirmed by Pricilla Loveless [phone]) on 03/25/2019 10:11:08 AM Also confirmed by Pricilla Loveless [phone]), editor Barbette Hair [phone])  on 03/25/2019 12:51:50 PM   Radiology Dg Chest Portable 1 View  Result Date: 03/25/2019 CLINICAL DATA:  18 year old female with vomiting. EXAM: PORTABLE CHEST 1 VIEW COMPARISON:  None. FINDINGS: The heart size and mediastinal contours are within normal limits. Both lungs are clear. The visualized skeletal structures are unremarkable. IMPRESSION: No active  disease. Electronically Signed   By: Gilmer Mor D.O.   On: 03/25/2019 10:18  Procedures Procedures (including critical care time)  Medications Ordered in ED Medications  lactated ringers bolus 1,000 mL (0 mLs Intravenous Stopped 03/25/19 1119)  metoCLOPramide (REGLAN) injection 10 mg (10 mg Intravenous Given 03/25/19 1019)  potassium chloride SA (K-DUR) CR tablet 40 mEq (40 mEq Oral Given 03/25/19 1209)  lactated ringers bolus 1,000 mL (0 mLs Intravenous Stopped 03/25/19 1238)     Initial Impression / Assessment and Plan / ED Course  I have reviewed the triage vital signs and the nursing notes.  Pertinent labs & imaging results that were available during my care of the patient were reviewed by me and considered in my medical decision making (see chart for details).        Patient appears to have hyperemesis.  She is mildly hypokalemic and was given oral potassium.  She does have an anion gap acidosis which is likely from the dehydration and mild lactate.  While her WBC has gone up I think this is likely from numerous episodes of vomiting rather than infection given no signs or symptoms of infection.  Urine is without UTI.  Chest x-ray is clear.  Given her dramatically improved in appearance with fluids and anti-medics, I think she is stable for discharge home. I think the lactic acid from dehydration.  We discussed return precautions but she appears stable for discharge home.  Final Clinical Impressions(s) / ED Diagnoses   Final diagnoses:  Hyperemesis arising during pregnancy  Hypokalemia  Dehydration    ED Discharge Orders         Ordered    metoCLOPramide (REGLAN) 10 MG tablet  Every 8 hours PRN     03/25/19 1322    potassium chloride SA (K-DUR) 20 MEQ tablet  Daily     03/25/19 1322    Doxylamine-Pyridoxine 10-10 MG TBEC  Daily at bedtime     03/25/19 1322           Pricilla Loveless, MD 03/25/19 1701

## 2019-03-31 ENCOUNTER — Other Ambulatory Visit: Payer: Self-pay

## 2019-03-31 ENCOUNTER — Emergency Department (HOSPITAL_COMMUNITY)
Admission: EM | Admit: 2019-03-31 | Discharge: 2019-03-31 | Disposition: A | Discharge status: Home / Self Care | Payer: BLUE CROSS/BLUE SHIELD | Attending: Emergency Medicine | Admitting: Emergency Medicine

## 2019-03-31 ENCOUNTER — Encounter (HOSPITAL_COMMUNITY): Payer: Self-pay

## 2019-03-31 DIAGNOSIS — Z3A01 Less than 8 weeks gestation of pregnancy: Secondary | ICD-10-CM | POA: Diagnosis not present

## 2019-03-31 DIAGNOSIS — O219 Vomiting of pregnancy, unspecified: Secondary | ICD-10-CM

## 2019-03-31 DIAGNOSIS — Z3A09 9 weeks gestation of pregnancy: Secondary | ICD-10-CM | POA: Diagnosis not present

## 2019-03-31 DIAGNOSIS — Z760 Encounter for issue of repeat prescription: Secondary | ICD-10-CM | POA: Insufficient documentation

## 2019-03-31 DIAGNOSIS — Z79899 Other long term (current) drug therapy: Secondary | ICD-10-CM | POA: Diagnosis not present

## 2019-03-31 DIAGNOSIS — F17210 Nicotine dependence, cigarettes, uncomplicated: Secondary | ICD-10-CM | POA: Diagnosis not present

## 2019-03-31 MED ORDER — METOCLOPRAMIDE HCL 10 MG PO TABS: 10.0000 mg | ORAL_TABLET | Freq: Four times a day (QID) | ORAL | 0 refills | Status: DC

## 2019-03-31 NOTE — Discharge Instructions (Addendum)
For morning sickness and nausea and vomiting in pregnancy you may take Diclegis.  This is an expensive prescription.  The active ingredients are the same as taking unisom or a similar brand (Doxylamine succinate) and vitamin B6 (Pyridoxine hydrochloride).  Please make sure the active ingredient matches the same name as there are many types of unisom and generic medications.  You may take 12.5 of unisom (or generic) every 4-6 hours, if needed you can take up to 25mg .  Please do not take more than 75mg  in 24 hours.   For the vitamin B6 (pyridoxine) you may take 10mg  up to twice a day.

## 2019-03-31 NOTE — ED Notes (Signed)
Bed: [facility]PT1 Expected date:  Expected time:  Means of arrival:  Comments: 

## 2019-03-31 NOTE — ED Provider Notes (Signed)
[facility]-EMERGENCY DEPT Provider Note   CSN: [id number] Arrival date & time: 03/31/19  1717    History   Chief Complaint Chief Complaint  Patient presents with  . Medication Refill    HPI Denise Fuentes is a 18 y.o. female.     18 y.o female G1P0 currently approximately [redacted] weeks pregnant with no PMH presents to the ED with a chief complaint of nausea and vomiting x 3 weeks. Patient reports she has been vomiting daily, with 15-20 minute episodes every day. She was given a prescription for Zofran which she completed in 1 week. She has been taking Reglan 10 mg every 8 hours along with Phenergan twice a day once in the morning and once at night. She has tried making an appointment with women's clinic, however she reports they are unable to see her until she is [redacted] weeks pregnant.  She denies any abdominal pain, chest pain, vaginal discharge, vaginal bleeding or fevers.      Past Medical History:  Diagnosis Date  . Kidney stones     Patient Active Problem List   Diagnosis Date Noted  . Chronic pelvic pain in female 11/01/2018    History reviewed. No pertinent surgical history.   OB History    Gravida  1   Para      Term      Preterm      AB      Living        SAB      TAB      Ectopic      Multiple      Live Births               Home Medications    Prior to Admission medications   Medication Sig Start Date End Date Taking? Authorizing Provider  Doxylamine-Pyridoxine 10-10 MG TBEC Take 1 tablet by mouth at bedtime. 03/25/19   Pricilla Loveless, MD  metoCLOPramide (REGLAN) 10 MG tablet Take 1 tablet (10 mg total) by mouth every 6 (six) hours for 7 days. 03/31/19 04/07/19  Claude Manges, PA-C  norgestimate-ethinyl estradiol (ORTHO-CYCLEN,SPRINTEC,PREVIFEM) 0.25-35 MG-MCG tablet Take 1 tablet by mouth daily. Patient not taking: Reported on 03/15/2019 10/25/18   Rasch, Victorino Dike I, NP  potassium chloride SA (K-DUR) 20 MEQ tablet Take 1  tablet (20 mEq total) by mouth daily for 5 days. 03/25/19 03/30/19  Pricilla Loveless, MD    Family History Family History  Problem Relation Age of Onset  . Cancer Father   . Cancer Maternal Grandmother   . Healthy Mother     Social History Social History   Tobacco Use  . Smoking status: Current Every Day Smoker    Packs/day: 0.25    Types: Cigarettes  . Smokeless tobacco: Never Used  Substance Use Topics  . Alcohol use: Yes    Frequency: Never  . Drug use: Never     Allergies   Amoxapine and related and Bee venom   Review of Systems Review of Systems  Constitutional: Negative for fever.  Gastrointestinal: Positive for nausea and vomiting.     Physical Exam Updated Vital Signs BP 124/70 (BP Location: Left Arm)   Pulse 80   Temp 97.8 F (36.6 C) (Oral)   Resp 16   LMP 02/13/2019   SpO2 99%   Physical Exam Vitals signs and nursing note reviewed.  Constitutional:      General: She is not in acute distress.    Appearance: She is well-developed. She  is not ill-appearing.     Comments: Not actively vomiting during encounter.  HENT:     Head: Normocephalic and atraumatic.     Mouth/Throat:     Mouth: Mucous membranes are dry.     Pharynx: No oropharyngeal exudate.  Eyes:     Pupils: Pupils are equal, round, and reactive to light.  Neck:     Musculoskeletal: Normal range of motion.  Cardiovascular:     Rate and Rhythm: Regular rhythm.     Heart sounds: Normal heart sounds.  Pulmonary:     Effort: Pulmonary effort is normal. No respiratory distress.     Breath sounds: Normal breath sounds.  Abdominal:     General: Bowel sounds are normal. There is no distension.     Palpations: Abdomen is soft.     Tenderness: There is no abdominal tenderness.  Musculoskeletal:        General: No tenderness or deformity.     Right lower leg: No edema.     Left lower leg: No edema.  Skin:    General: Skin is warm and dry.  Neurological:     Mental Status: She is alert  and oriented to person, place, and time.      ED Treatments / Results  Labs (all labs ordered are listed, but only abnormal results are displayed) Labs Reviewed - No data to display  EKG None  Radiology No results found.  Procedures Procedures (including critical care time)  Medications Ordered in ED Medications - No data to display   Initial Impression / Assessment and Plan / ED Course  I have reviewed the triage vital signs and the nursing notes.  Pertinent labs & imaging results that were available during my care of the patient were reviewed by me and considered in my medical decision making (see chart for details).    Patient with no PMH G1P0 currently under [redacted] weeks pregnant, presents to the ED requesting a medication refill.  Patient was seen here on May 1, given a prescription for Reglan, Phenergan, Zofran.  She reports she was taking Zofran for 1 week, completed therapy which did not help with symptoms as much.  Has been taking Reglan along with Phenergan which has helped with symptoms.  She reports daily emesis episodes lasting 15 to 20 minutes, is unable to even keep water down. She is also try some over-the-counter B6 candy but reports she is unable to tolerate this, she also has tried ginger without success.  During evaluation patient is well-appearing, no vomiting episodes while in the room for examination.  Patient has been instructed to be seen at the [facility], she reports trying to make an appointment with women's clinic, was told she could not be seen there until she was at least [redacted] weeks pregnant.  Patient has not established an OB/GYN at this time, today she voices no abdominal pain, no chest pain, no shortness of breath, no swelling to her legs, no complaints at this time.  I have granted patient a refill on Reglan, instructed that this medication has risks, patient agrees to take this medication at this time.  Encouraged to follow-up at [facility].  Patient understands and agrees with management at this time.  Return precautions provided.  Portions of this note were generated with Scientist, clinical (histocompatibility and immunogenetics). Dictation errors may occur despite best attempts at proofreading.     Final Clinical Impressions(s) / ED Diagnoses   Final diagnoses:  Medication refill  Nausea  and vomiting during pregnancy prior to [redacted] weeks gestation    ED Discharge Orders         Ordered    metoCLOPramide (REGLAN) 10 MG tablet  Every 6 hours     03/31/19 1758           Claude MangesSoto, Kimie Pidcock, PA-C 03/31/19 1803    Alvira MondaySchlossman, Erin, MD 04/02/19 0006

## 2019-03-31 NOTE — ED Triage Notes (Signed)
Patient dropped off by boyfriend.   Patient found out she was pregnant [redacted] weeks ago. Patient was having sever nausea and emesis and given Zofran.  Patient seen here last week for same and given metoclopramide.   Patient reports running out of medication and just needs more.   A/Ox4 Ambulatory in triage.

## 2019-03-31 NOTE — ED Notes (Addendum)
Johana, PA at bedside.  

## 2019-04-01 ENCOUNTER — Encounter (HOSPITAL_COMMUNITY): Payer: Self-pay | Admitting: *Deleted

## 2019-04-01 ENCOUNTER — Inpatient Hospital Stay (HOSPITAL_COMMUNITY): Payer: BLUE CROSS/BLUE SHIELD

## 2019-04-01 ENCOUNTER — Other Ambulatory Visit: Payer: Self-pay

## 2019-04-01 ENCOUNTER — Inpatient Hospital Stay (HOSPITAL_COMMUNITY)
Admission: AD | Admit: 2019-04-01 | Discharge: 2019-04-01 | Disposition: A | Discharge status: Home / Self Care | Payer: BLUE CROSS/BLUE SHIELD | Attending: Obstetrics and Gynecology | Admitting: Obstetrics and Gynecology

## 2019-04-01 DIAGNOSIS — O99331 Smoking (tobacco) complicating pregnancy, first trimester: Secondary | ICD-10-CM | POA: Insufficient documentation

## 2019-04-01 DIAGNOSIS — O26891 Other specified pregnancy related conditions, first trimester: Secondary | ICD-10-CM | POA: Insufficient documentation

## 2019-04-01 DIAGNOSIS — O210 Mild hyperemesis gravidarum: Secondary | ICD-10-CM | POA: Diagnosis not present

## 2019-04-01 DIAGNOSIS — F1290 Cannabis use, unspecified, uncomplicated: Secondary | ICD-10-CM

## 2019-04-01 DIAGNOSIS — R1902 Left upper quadrant abdominal swelling, mass and lump: Secondary | ICD-10-CM

## 2019-04-01 DIAGNOSIS — O99321 Drug use complicating pregnancy, first trimester: Secondary | ICD-10-CM

## 2019-04-01 DIAGNOSIS — Z880 Allergy status to penicillin: Secondary | ICD-10-CM | POA: Insufficient documentation

## 2019-04-01 DIAGNOSIS — F12988 Cannabis use, unspecified with other cannabis-induced disorder: Secondary | ICD-10-CM

## 2019-04-01 DIAGNOSIS — B9689 Other specified bacterial agents as the cause of diseases classified elsewhere: Secondary | ICD-10-CM

## 2019-04-01 DIAGNOSIS — O26899 Other specified pregnancy related conditions, unspecified trimester: Secondary | ICD-10-CM

## 2019-04-01 DIAGNOSIS — N898 Other specified noninflammatory disorders of vagina: Secondary | ICD-10-CM | POA: Insufficient documentation

## 2019-04-01 DIAGNOSIS — R112 Nausea with vomiting, unspecified: Secondary | ICD-10-CM

## 2019-04-01 DIAGNOSIS — F17210 Nicotine dependence, cigarettes, uncomplicated: Secondary | ICD-10-CM | POA: Insufficient documentation

## 2019-04-01 DIAGNOSIS — Z3A01 Less than 8 weeks gestation of pregnancy: Secondary | ICD-10-CM | POA: Diagnosis not present

## 2019-04-01 DIAGNOSIS — R109 Unspecified abdominal pain: Secondary | ICD-10-CM | POA: Diagnosis not present

## 2019-04-01 DIAGNOSIS — Z3A08 8 weeks gestation of pregnancy: Secondary | ICD-10-CM | POA: Diagnosis not present

## 2019-04-01 LAB — URINALYSIS, ROUTINE W REFLEX MICROSCOPIC
Bilirubin Urine: NEGATIVE
Glucose, UA: NEGATIVE mg/dL
Hgb urine dipstick: NEGATIVE
Ketones, ur: 80 mg/dL — AB
Leukocytes,Ua: NEGATIVE
Nitrite: NEGATIVE
Protein, ur: 30 mg/dL — AB
Specific Gravity, Urine: 1.026 (ref 1.005–1.030)
pH: 7 (ref 5.0–8.0)

## 2019-04-01 LAB — WET PREP, GENITAL
Sperm: NONE SEEN
Trich, Wet Prep: NONE SEEN
Yeast Wet Prep HPF POC: NONE SEEN

## 2019-04-01 LAB — GC/CHLAMYDIA PROBE AMP (~~LOC~~) NOT AT ARMC
Chlamydia: NEGATIVE
Neisseria Gonorrhea: NEGATIVE

## 2019-04-01 LAB — RAPID URINE DRUG SCREEN, HOSP PERFORMED
Amphetamines: NOT DETECTED
Barbiturates: NOT DETECTED
Benzodiazepines: NOT DETECTED
Cocaine: NOT DETECTED
Opiates: NOT DETECTED
Tetrahydrocannabinol: POSITIVE — AB

## 2019-04-01 MED ORDER — SCOPOLAMINE 1 MG/3DAYS TD PT72: 1.0000 | MEDICATED_PATCH | Freq: Once | TRANSDERMAL | 1 refills | Status: AC

## 2019-04-01 MED ORDER — LACTATED RINGERS IV BOLUS
1000.0000 mL | Freq: Once | INTRAVENOUS | Status: AC
  Administered 2019-04-01: 1000 mL via INTRAVENOUS

## 2019-04-01 MED ORDER — METOCLOPRAMIDE HCL 5 MG/ML IJ SOLN: 5.0000 mg | Freq: Once | INTRAMUSCULAR | Status: DC

## 2019-04-01 MED ORDER — METOCLOPRAMIDE HCL 5 MG/ML IJ SOLN
10.0000 mg | Freq: Once | INTRAMUSCULAR | Status: AC
  Administered 2019-04-01: 10 mg via INTRAVENOUS
  Filled 2019-04-01: qty 2

## 2019-04-01 MED ORDER — PANTOPRAZOLE SODIUM 20 MG PO TBEC: 20.0000 mg | DELAYED_RELEASE_TABLET | Freq: Every day | ORAL | 3 refills | Status: DC

## 2019-04-01 MED ORDER — MORPHINE SULFATE (PF) 4 MG/ML IV SOLN
1.0000 mg | Freq: Once | INTRAVENOUS | Status: AC
  Administered 2019-04-01: 1 mg via INTRAVENOUS

## 2019-04-01 MED ORDER — PANTOPRAZOLE SODIUM 40 MG IV SOLR
40.0000 mg | Freq: Once | INTRAVENOUS | Status: AC
  Administered 2019-04-01: 40 mg via INTRAVENOUS
  Filled 2019-04-01: qty 40

## 2019-04-01 MED ORDER — SCOPOLAMINE 1 MG/3DAYS TD PT72
1.0000 | MEDICATED_PATCH | Freq: Once | TRANSDERMAL | Status: DC
  Administered 2019-04-01: 1.5 mg via TRANSDERMAL
  Filled 2019-04-01: qty 1

## 2019-04-01 MED ORDER — METRONIDAZOLE 0.75 % VA GEL: 1.0000 | Freq: Every day | VAGINAL | 0 refills | Status: DC

## 2019-04-01 MED ORDER — MORPHINE SULFATE (PF) 4 MG/ML IV SOLN
1.0000 mg | Freq: Once | INTRAVENOUS | Status: DC
  Filled 2019-04-01: qty 1

## 2019-04-01 NOTE — MAU Note (Signed)
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

## 2019-04-01 NOTE — Discharge Instructions (Signed)
Cannabinoid Hyperemesis Syndrome °Cannabinoid hyperemesis syndrome (CHS) is a condition that causes repeated nausea, vomiting, and abdominal pain after long-term (chronic) use of marijuana (cannabis). People with CHS typically use marijuana 3-5 times a day for many years before they have symptoms, although it is possible to develop CHS with as little as 1 use per day. °Symptoms of CHS may be mild at first but can get worse and more frequent. In some cases, CHS may cause vomiting many times a day, which can lead to weight loss and dehydration. CHS may go away and come back many times (recur). People may not have symptoms or may otherwise be healthy in between CHS attacks. °What are the causes? °The exact cause of this condition is not known. Long-term use of marijuana may over-stimulate certain proteins in the brain that react with chemicals in marijuana (cannabinoid receptors). This over-stimulation may cause CHS. °What are the signs or symptoms? °Symptoms of this condition are often mild during the first few attacks, but they can get worse over time. Symptoms may include: °· Frequent nausea, especially early in the morning. °· Vomiting. °· Abdominal pain. °Taking several hot showers throughout the day can also be a sign of this condition. People with CHS may do this because it relieves symptoms. °How is this diagnosed? °This condition may be diagnosed based on: °· Your symptoms and medical history, including any drug use. °· A physical exam. °You may have tests done to rule out other problems. These tests may include: °· Blood tests. °· Urine tests. °· Imaging tests, such as an X-ray or CT scan. °How is this treated? °Treatment for this condition involves stopping marijuana use. Your health care provider may recommend: °· A drug rehabilitation program, if you have trouble stopping marijuana use. °· Medicines for nausea. °· Hot showers to help relieve symptoms. °Certain creams that contain a substance called  capsaicin may improve symptoms when applied to the abdomen. Ask your health care provider before starting any medicines or other treatments. °Severe nausea and vomiting may require you to stay at the hospital. You may need IV fluids to prevent or treat dehydration. You may also need certain medicines that must be given at the hospital. °Follow these instructions at home: °During an attack ° °· Stay in bed and rest in a dark, quiet room. °· Take anti-nausea medicine as told by your health care provider. °· Try taking hot showers to relieve your symptoms. °After an attack °· Drink small amounts of clear fluids slowly. Gradually add more. °· Once you are able to eat without vomiting, eat soft foods in small amounts every 3-4 hours. °General instructions ° °· Do not use any products that contain marijuana.If you need help quitting, ask your health care provider for resources and treatment options. °· Drink enough fluid to keep your urine pale yellow. Avoid drinking fluids that have a lot of sugar or caffeine, such as coffee and soda. °· Take and apply over-the-counter and prescription medicines only as told by your health care provider. Ask your health care provider before starting any new medicines or treatments. °· Keep all follow-up visits as told by your health care provider. This is important. °Contact a health care provider if: °· Your symptoms get worse. °· You cannot drink fluids without vomiting. °· You have pain and trouble swallowing after an attack. °Get help right away if: °· You cannot stop vomiting. °· You have blood in your vomit or your vomit looks like coffee grounds. °· You have   severe abdominal pain. °· You have stools that are bloody or black, or stools that look like tar. °· You have symptoms of dehydration, such as: °? Sunken eyes. °? Inability to make tears. °? Cracked lips. °? Dry mouth. °? Decreased urine production. °? Weakness. °? Sleepiness. °? Fainting. °Summary °· Cannabinoid hyperemesis  syndrome (CHS) is a condition that causes repeated nausea, vomiting, and abdominal pain after long-term use of marijuana. °· People with CHS typically use marijuana 3-5 times a day for many years before they have symptoms, although it is possible to develop CHS with as little as 1 use per day. °· Treatment for this condition involves stopping marijuana use. Hot showers and capsaicin creams may also help relieve symptoms. Ask your health care provider before starting any medicines or other treatments. °· Your health care provider may prescribe medicines to help with nausea. °· Get help right away if you have signs of dehydration, such as dry mouth, decreased urine production, or weakness. °This information is not intended to replace advice given to you by your health care provider. Make sure you discuss any questions you have with your health care provider. °Document Released: 02/08/2017 Document Revised: 02/08/2017 Document Reviewed: 02/08/2017 °[employer] Interactive Patient Education © 2019 [employer] ° °

## 2019-04-01 NOTE — MAU Provider Note (Signed)
History     CSN: [id number]  Arrival date and time: 04/01/19 4098   First Provider Initiated Contact with Patient 04/01/19 0139      Chief Complaint  Patient presents with  . Abdominal Pain  . Emesis   Denise Fuentes is a 18 y.o. G1P0 at [redacted]w[redacted]d who presents for Abdominal Pain and Emesis.  She reports "severe nausea and vomiting" for the past 3 weeks.  She reports at least 20 incidents of vomiting today since 1030 am.  She states she was taking reglan with good results, but ran out this morning.  She states she went to the ED for a refill, but has not received relief as she previously was.  Patient reports eating sweet and sour chicken last night without issues, but has not had any food or drinks today.  She reports abdominal pain "on the bottom and in the middle," that she rates a 7/10 but has not taken attempted to relieve.  She also endorses vaginal discharge that is white that started about 3 weeks ago, but she regards it as "normal for me."  Patient denies a history of drug usage      OB History    Gravida  1   Para      Term      Preterm      AB      Living        SAB      TAB      Ectopic      Multiple      Live Births              Past Medical History:  Diagnosis Date  . Kidney stones     Past Surgical History:  Procedure Laterality Date  . TONSILLECTOMY      Family History  Problem Relation Age of Onset  . Cancer Father   . Cancer Maternal Grandmother   . Healthy Mother     Social History   Tobacco Use  . Smoking status: Current Every Day Smoker    Packs/day: 0.25    Types: Cigarettes  . Smokeless tobacco: Never Used  Substance Use Topics  . Alcohol use: Not Currently    Frequency: Never  . Drug use: Never    Allergies:  Allergies  Allergen Reactions  . Amoxapine And Related Anaphylaxis  . Amoxicillin Anaphylaxis  . Bee Venom Hives    Medications Prior to Admission  Medication Sig Dispense Refill Last Dose  .  metoCLOPramide (REGLAN) 10 MG tablet Take 1 tablet (10 mg total) by mouth every 6 (six) hours for 7 days. 20 tablet 0 04/01/2019 at Unknown time  . ondansetron (ZOFRAN) 4 MG tablet Take 4 mg by mouth every 8 (eight) hours as needed for nausea or vomiting.   Past Month at Unknown time  . promethazine (PHENERGAN) 25 MG tablet Take 25 mg by mouth every 6 (six) hours as needed for nausea or vomiting.   04/01/2019 at Unknown time  . Doxylamine-Pyridoxine 10-10 MG TBEC Take 1 tablet by mouth at bedtime. 30 tablet 0 Unknown at Unknown time  . norgestimate-ethinyl estradiol (ORTHO-CYCLEN,SPRINTEC,PREVIFEM) 0.25-35 MG-MCG tablet Take 1 tablet by mouth daily. (Patient not taking: Reported on 03/15/2019) 1 Package 11 Not Taking at Unknown time  . potassium chloride SA (K-DUR) 20 MEQ tablet Take 1 tablet (20 mEq total) by mouth daily for 5 days. 5 tablet 0     Review of Systems  Constitutional: Negative for chills and fever.  Respiratory: Positive for cough (x3 weeks) and shortness of breath (x 3 weeks).   Gastrointestinal: Positive for abdominal pain (7/10), nausea and vomiting. Negative for constipation and diarrhea.  Genitourinary: Positive for vaginal discharge (White "normal to me"). Negative for dysuria and vaginal bleeding.  Neurological: Positive for headaches (7/10). Negative for dizziness and light-headedness.   Physical Exam   Blood pressure 117/89, pulse (!) 111, temperature 98.7 F (37.1 C), temperature source Oral, weight 75 kg, last menstrual period 02/11/2019.  Physical Exam  Constitutional: She is oriented to person, place, and time. She appears well-developed and well-nourished. She appears distressed.  HENT:  Head: Normocephalic and atraumatic.  Eyes: Conjunctivae are normal.  Neck: Normal range of motion.  Cardiovascular: Normal rate, regular rhythm and normal heart sounds.  Respiratory: Effort normal and breath sounds normal.  GI: Soft. Bowel sounds are normal. She exhibits mass (LUQ  ~10x3cm below ribs.). She exhibits no distension. There is no abdominal tenderness.  Musculoskeletal: Normal range of motion.        General: No edema.  Neurological: She is alert and oriented to person, place, and time.  Skin: Skin is warm and dry.  Psychiatric: She has a normal mood and affect. Her behavior is normal.    MAU Course  Procedures Results for orders placed or performed during the hospital encounter of 04/01/19 (from the past 24 hour(s))  Urinalysis, Routine w reflex microscopic     Status: Abnormal   Collection Time: 04/01/19  1:15 AM  Result Value Ref Range   Color, Urine YELLOW YELLOW   APPearance HAZY (A) CLEAR   Specific Gravity, Urine 1.026 1.005 - 1.030   pH 7.0 5.0 - 8.0   Glucose, UA NEGATIVE NEGATIVE mg/dL   Hgb urine dipstick NEGATIVE NEGATIVE   Bilirubin Urine NEGATIVE NEGATIVE   Ketones, ur 80 (A) NEGATIVE mg/dL   Protein, ur 30 (A) NEGATIVE mg/dL   Nitrite NEGATIVE NEGATIVE   Leukocytes,Ua NEGATIVE NEGATIVE   RBC / HPF 0-5 0 - 5 RBC/hpf   WBC, UA 0-5 0 - 5 WBC/hpf   Bacteria, UA RARE (A) NONE SEEN   Squamous Epithelial / LPF 0-5 0 - 5   Mucus PRESENT   Urine rapid drug screen (hosp performed)     Status: Abnormal   Collection Time: 04/01/19  1:45 AM  Result Value Ref Range   Opiates NONE DETECTED NONE DETECTED   Cocaine NONE DETECTED NONE DETECTED   Benzodiazepines NONE DETECTED NONE DETECTED   Amphetamines NONE DETECTED NONE DETECTED   Tetrahydrocannabinol POSITIVE (A) NONE DETECTED   Barbiturates NONE DETECTED NONE DETECTED  Wet prep, genital     Status: Abnormal   Collection Time: 04/01/19  2:31 AM  Result Value Ref Range   Yeast Wet Prep HPF POC NONE SEEN NONE SEEN   Trich, Wet Prep NONE SEEN NONE SEEN   Clue Cells Wet Prep HPF POC PRESENT (A) NONE SEEN   WBC, Wet Prep HPF POC MANY (A) NONE SEEN   Sperm NONE SEEN    US Spleen (abdomen Limited)  Result Date: 04/01/2019 CLINICAL DATA:  18 year old female with abdominal pain in the 1st  trimester of pregnancy and questionable left upper quadrant palpable mass on exam. EXAM: ULTRASOUND ABDOMEN LIMITED COMPARISON:  Ob ultrasound today reported separately. FINDINGS: Grayscale and color Doppler images of the left upper quadrant and spleen. Splenic size, contour and echogenicity are within normal limits. Splenic length is 7.5 centimeters (normal). Normal visible left kidney (image 7). No free fluid. No  left upper quadrant abnormality. IMPRESSION: Normal spleen with no ultrasound abnormality in the left upper quadrant. Electronically Signed   By: Odessa FlemingH  Hall M.D.   On: 04/01/2019 03:58   [place] Ob Less Than 14 Weeks With Ob Transvaginal  Result Date: 04/01/2019 CLINICAL DATA:  18 year old female with abdominal pain in the 1st trimester of pregnancy. Estimated gestational age by LMP 7 weeks 0 days. EXAM: OBSTETRIC <14 WK US AND TRANSVAGINAL OB US TECHNIQUE: Both transabdominal and transvaginal ultrasound examinations were performed for complete evaluation of the gestation as well as the maternal uterus, adnexal regions, and pelvic cul-de-sac. Transvaginal technique was performed to assess early pregnancy. COMPARISON:  Limited abdomen ultrasound today reported separately. FINDINGS: Intrauterine gestational sac: Single Yolk sac:  Visible Embryo:  Visible Cardiac Activity: Detected Heart Rate: 174 bpm CRL:  19.5 mm   8 w   3 d                  US EDC: 11/08/2019 Subchorionic hemorrhage:  None visualized. Maternal uterus/adnexae: Rounded mixed echogenicity 5.5 centimeter mass in the right side uterine body is favored to be a fibroid (image 31). The right ovary appears normal measuring 3.1 x 1.7 x 2.2 centimeters. The left ovary appears normal measuring 3.2 x 1.3 x 1.7 centimeters. No pelvic free fluid. IMPRESSION: 1.  Single living IUP demonstrated. 2. Fibroid uterus.  No acute maternal findings visualized. Electronically Signed   By: Odessa FlemingH  Hall M.D.   On: 04/01/2019 03:56    MDM Start IV LR Bolus   Antiemetic PPI Pain Medication Assessment and Plan  18 year old, G1P0  7 weeks by Unsure LMP Abdominal Pain Vaginal Discharge N/V  -Patient tearful and with vomiting. -Exam deferred at current. -Start IV with LR bolus -Reglan 10mg  -Protonix 40mg  -Morphine 1mg  -UDS added to previous collection. -Will return to complete exam and reassess. -Nurse to collect cultures via blind swab after above interventions and patient improved.   Follow Up (2:53 AM)  -Nurse reports collection of swabs and patient improvement.  -In room to complete exam. -Exam findings significant for LUQ mass. -Patient reports feeling better. -Discussed need for US to evaluate abdominal as well as pregnancy location. -Radiologist Dr. Margo AyeHall consulted who advised limited abdominal US and if findings equivocal or requiring further imaging then MRI w/o contrast.   *Appreciation for his involvement expressed. -US Spleen ordered as well as a OB transvaginal. -Will send now and await results.    Follow Up (4:00 AM) Bacterial Vaginosis Normal Spleen 8.3 weeks by US Cannabinoid Hyperemesis   -Wet prep returns significant for clue cells -Results discussed with patient. -Rx for Metrogel 0.75% PV QHS x 5days sent to pharmacy on file.  -Also discussed THC positive urine and patient now admits to smoking 1-2 joints daily prior to pregnancy. *Admits to MJ usage 2-3 days ago for nausea-Encouraged cessation.  *Educated on cannabinoid hyperemesis and what to expect. *Rx for Protonix 40 mg and Scopolamine patches sent to pharmacy. *Encouraged to receive refills from primary office [facility] as needed. -No questions or concerns. -Mass no longer palpated on exam. -Encouraged to call or return to MAU if symptoms worsen or with the onset of new symptoms. -Discharged to home in improved condition  Cherre RobinsJessica L Dimples Probus MSN, CNM 04/01/2019, 1:39 AM

## 2019-04-01 NOTE — MAU Note (Signed)
Pt presents to MAU c/o abdominal pain, vomiting, and chest pain. Pt states her whole stomach feels there is pressure on it 7/10 pain. Pt states the pain in her chest only happens when she vomits and it is only on her left side of her chest that is a stabbing pain. Pt states this has been ongoing for 3 weeks. Pt states she was taking Zofran then she stopped and the pain came back. Pt states she has been taking Reglan and phenergan and noting is helping. No vaginal bleeding pt reports a little white discharge not a lot. Pt also reports HA when she vomits and cries.

## 2019-04-11 ENCOUNTER — Telehealth: Payer: Self-pay

## 2019-04-11 ENCOUNTER — Other Ambulatory Visit: Payer: Self-pay

## 2019-04-11 DIAGNOSIS — R110 Nausea: Secondary | ICD-10-CM

## 2019-04-11 MED ORDER — METOCLOPRAMIDE HCL 10 MG PO TABS: 10.0000 mg | ORAL_TABLET | Freq: Four times a day (QID) | ORAL | 0 refills | Status: DC

## 2019-04-11 NOTE — Telephone Encounter (Signed)
Pt called thru Nurse Line today at 10:23a wanting refill of Reglan. Sent to [facility] in [place]. Pt verbalized understanding.

## 2019-04-12 ENCOUNTER — Other Ambulatory Visit: Payer: Self-pay

## 2019-04-12 ENCOUNTER — Telehealth: Payer: Self-pay

## 2019-04-12 DIAGNOSIS — R110 Nausea: Secondary | ICD-10-CM

## 2019-04-12 MED ORDER — METOCLOPRAMIDE HCL 10 MG PO TABS: 10.0000 mg | ORAL_TABLET | Freq: Four times a day (QID) | ORAL | 2 refills | Status: DC

## 2019-04-12 NOTE — Telephone Encounter (Signed)
Called pt to insure that Rx for Reglan was sent to [facility] in [facility], no answer, left VM.

## 2019-04-19 ENCOUNTER — Telehealth: Payer: Self-pay | Admitting: Obstetrics & Gynecology

## 2019-04-19 NOTE — Telephone Encounter (Signed)
Called the patient to inform of the upcoming virtual visit. No answer, left a detailed voicemail.

## 2019-04-22 ENCOUNTER — Telehealth: Payer: BC Managed Care – PPO | Admitting: General Practice

## 2019-04-22 ENCOUNTER — Other Ambulatory Visit: Payer: Self-pay

## 2019-04-22 DIAGNOSIS — Z3A08 8 weeks gestation of pregnancy: Secondary | ICD-10-CM

## 2019-04-22 NOTE — Progress Notes (Signed)
817- Called patient for new OB intake appt, no answer- unable to leave message as voicemail was full  839- Called patient for new OB intake appt, no answer- unable to leave message as voicemail was full. Appt will need to be rescheduled.  Koren Bound RN BSN 04/22/19

## 2019-04-23 ENCOUNTER — Other Ambulatory Visit: Payer: Self-pay | Admitting: *Deleted

## 2019-04-23 ENCOUNTER — Encounter: Payer: Self-pay | Admitting: Family Medicine

## 2019-04-23 ENCOUNTER — Telehealth: Payer: Self-pay | Admitting: Family Medicine

## 2019-04-23 DIAGNOSIS — R110 Nausea: Secondary | ICD-10-CM

## 2019-04-23 MED ORDER — METOCLOPRAMIDE HCL 10 MG PO TABS: 10.0000 mg | ORAL_TABLET | Freq: Four times a day (QID) | ORAL | 2 refills | Status: DC

## 2019-04-23 NOTE — Telephone Encounter (Signed)
Attempted to contact patient in regards of her missed New OB intake appointment and to get her rescheduled. No answer, left voicemail for the patient to give [place] a call to be rescheduled. No show letter mailed.

## 2019-05-03 ENCOUNTER — Encounter: Payer: BLUE CROSS/BLUE SHIELD | Admitting: Obstetrics & Gynecology

## 2019-05-29 DIAGNOSIS — Z3402 Encounter for supervision of normal first pregnancy, second trimester: Secondary | ICD-10-CM | POA: Diagnosis not present

## 2019-05-29 DIAGNOSIS — Z3689 Encounter for other specified antenatal screening: Secondary | ICD-10-CM | POA: Diagnosis not present

## 2019-05-29 DIAGNOSIS — Z113 Encounter for screening for infections with a predominantly sexual mode of transmission: Secondary | ICD-10-CM | POA: Diagnosis not present

## 2019-05-29 DIAGNOSIS — Z0000 Encounter for general adult medical examination without abnormal findings: Secondary | ICD-10-CM | POA: Diagnosis not present

## 2019-05-29 DIAGNOSIS — Z3401 Encounter for supervision of normal first pregnancy, first trimester: Secondary | ICD-10-CM | POA: Diagnosis not present

## 2019-05-29 DIAGNOSIS — Z361 Encounter for antenatal screening for raised alphafetoprotein level: Secondary | ICD-10-CM | POA: Diagnosis not present

## 2019-06-20 DIAGNOSIS — Z3401 Encounter for supervision of normal first pregnancy, first trimester: Secondary | ICD-10-CM | POA: Diagnosis not present

## 2019-06-20 DIAGNOSIS — Z363 Encounter for antenatal screening for malformations: Secondary | ICD-10-CM | POA: Diagnosis not present

## 2019-06-20 DIAGNOSIS — Z3402 Encounter for supervision of normal first pregnancy, second trimester: Secondary | ICD-10-CM | POA: Diagnosis not present

## 2019-07-08 DIAGNOSIS — Z3A22 22 weeks gestation of pregnancy: Secondary | ICD-10-CM | POA: Diagnosis not present

## 2019-07-08 DIAGNOSIS — O210 Mild hyperemesis gravidarum: Secondary | ICD-10-CM | POA: Diagnosis not present

## 2019-07-08 DIAGNOSIS — O283 Abnormal ultrasonic finding on antenatal screening of mother: Secondary | ICD-10-CM | POA: Diagnosis not present

## 2019-08-23 DIAGNOSIS — Z3A29 29 weeks gestation of pregnancy: Secondary | ICD-10-CM | POA: Diagnosis not present

## 2019-08-23 DIAGNOSIS — Z23 Encounter for immunization: Secondary | ICD-10-CM | POA: Diagnosis not present

## 2019-08-23 DIAGNOSIS — O210 Mild hyperemesis gravidarum: Secondary | ICD-10-CM | POA: Diagnosis not present

## 2019-08-23 DIAGNOSIS — Z3689 Encounter for other specified antenatal screening: Secondary | ICD-10-CM | POA: Diagnosis not present

## 2019-09-02 DIAGNOSIS — M5489 Other dorsalgia: Secondary | ICD-10-CM | POA: Diagnosis not present

## 2019-09-19 DIAGNOSIS — Z3A32 32 weeks gestation of pregnancy: Secondary | ICD-10-CM | POA: Diagnosis not present

## 2019-09-19 DIAGNOSIS — O210 Mild hyperemesis gravidarum: Secondary | ICD-10-CM | POA: Diagnosis not present

## 2019-10-23 DIAGNOSIS — Z3685 Encounter for antenatal screening for Streptococcus B: Secondary | ICD-10-CM | POA: Diagnosis not present

## 2019-10-23 DIAGNOSIS — Z3403 Encounter for supervision of normal first pregnancy, third trimester: Secondary | ICD-10-CM | POA: Diagnosis not present

## 2019-10-29 DIAGNOSIS — Z3A38 38 weeks gestation of pregnancy: Secondary | ICD-10-CM | POA: Diagnosis not present

## 2019-10-29 DIAGNOSIS — Z3403 Encounter for supervision of normal first pregnancy, third trimester: Secondary | ICD-10-CM | POA: Diagnosis not present

## 2019-10-29 DIAGNOSIS — O133 Gestational [pregnancy-induced] hypertension without significant proteinuria, third trimester: Secondary | ICD-10-CM | POA: Diagnosis not present

## 2019-11-05 DIAGNOSIS — Z3A39 39 weeks gestation of pregnancy: Secondary | ICD-10-CM | POA: Diagnosis not present

## 2019-11-05 DIAGNOSIS — O133 Gestational [pregnancy-induced] hypertension without significant proteinuria, third trimester: Secondary | ICD-10-CM | POA: Diagnosis not present

## 2019-11-06 ENCOUNTER — Inpatient Hospital Stay (HOSPITAL_COMMUNITY): Payer: Medicaid Other | Admitting: Anesthesiology

## 2019-11-06 ENCOUNTER — Encounter (HOSPITAL_COMMUNITY)
Admission: AD | Disposition: A | Discharge status: Home / Self Care | Payer: Self-pay | Source: Home / Self Care | Attending: Obstetrics and Gynecology

## 2019-11-06 ENCOUNTER — Other Ambulatory Visit: Payer: Self-pay

## 2019-11-06 ENCOUNTER — Inpatient Hospital Stay (HOSPITAL_COMMUNITY)
Admission: AD | Admit: 2019-11-06 | Discharge: 2019-11-08 | DRG: 787 | Disposition: A | Discharge status: Home / Self Care | Payer: Medicaid Other | Attending: Obstetrics and Gynecology | Admitting: Obstetrics and Gynecology

## 2019-11-06 ENCOUNTER — Encounter (HOSPITAL_COMMUNITY): Payer: Self-pay | Admitting: Obstetrics & Gynecology

## 2019-11-06 DIAGNOSIS — Z20828 Contact with and (suspected) exposure to other viral communicable diseases: Secondary | ICD-10-CM | POA: Diagnosis present

## 2019-11-06 DIAGNOSIS — O4703 False labor before 37 completed weeks of gestation, third trimester: Secondary | ICD-10-CM

## 2019-11-06 DIAGNOSIS — O9081 Anemia of the puerperium: Secondary | ICD-10-CM | POA: Diagnosis not present

## 2019-11-06 DIAGNOSIS — Z3A39 39 weeks gestation of pregnancy: Secondary | ICD-10-CM

## 2019-11-06 DIAGNOSIS — O134 Gestational [pregnancy-induced] hypertension without significant proteinuria, complicating childbirth: Principal | ICD-10-CM | POA: Diagnosis present

## 2019-11-06 DIAGNOSIS — O99824 Streptococcus B carrier state complicating childbirth: Secondary | ICD-10-CM | POA: Diagnosis present

## 2019-11-06 DIAGNOSIS — D62 Acute posthemorrhagic anemia: Secondary | ICD-10-CM | POA: Diagnosis not present

## 2019-11-06 DIAGNOSIS — Z87891 Personal history of nicotine dependence: Secondary | ICD-10-CM | POA: Diagnosis not present

## 2019-11-06 DIAGNOSIS — R030 Elevated blood-pressure reading, without diagnosis of hypertension: Secondary | ICD-10-CM | POA: Diagnosis not present

## 2019-11-06 DIAGNOSIS — O133 Gestational [pregnancy-induced] hypertension without significant proteinuria, third trimester: Secondary | ICD-10-CM | POA: Diagnosis present

## 2019-11-06 DIAGNOSIS — Z3A00 Weeks of gestation of pregnancy not specified: Secondary | ICD-10-CM | POA: Diagnosis not present

## 2019-11-06 LAB — CBC
HCT: 32.2 % — ABNORMAL LOW (ref 36.0–46.0)
HCT: 27.4 % — ABNORMAL LOW (ref 36.0–46.0)
Hemoglobin: 10.7 g/dL — ABNORMAL LOW (ref 12.0–15.0)
Hemoglobin: 9 g/dL — ABNORMAL LOW (ref 12.0–15.0)
MCH: 27.1 pg (ref 26.0–34.0)
MCH: 27.2 pg (ref 26.0–34.0)
MCHC: 33.2 g/dL (ref 30.0–36.0)
MCHC: 32.8 g/dL (ref 30.0–36.0)
MCV: 81.5 fL (ref 80.0–100.0)
MCV: 82.8 fL (ref 80.0–100.0)
Platelets: 296 10*3/uL (ref 150–400)
Platelets: 283 10*3/uL (ref 150–400)
RBC: 3.95 MIL/uL (ref 3.87–5.11)
RBC: 3.31 MIL/uL — ABNORMAL LOW (ref 3.87–5.11)
RDW: 13.9 % (ref 11.5–15.5)
RDW: 13.9 % (ref 11.5–15.5)
WBC: 17.7 10*3/uL — ABNORMAL HIGH (ref 4.0–10.5)
WBC: 28 10*3/uL — ABNORMAL HIGH (ref 4.0–10.5)
nRBC: 0 % (ref 0.0–0.2)
nRBC: 0 % (ref 0.0–0.2)

## 2019-11-06 LAB — COMPREHENSIVE METABOLIC PANEL
ALT: 13 U/L (ref 0–44)
AST: 18 U/L (ref 15–41)
Albumin: 2.7 g/dL — ABNORMAL LOW (ref 3.5–5.0)
Alkaline Phosphatase: 186 U/L — ABNORMAL HIGH (ref 38–126)
Anion gap: 10 (ref 5–15)
BUN: 8 mg/dL (ref 6–20)
CO2: 21 mmol/L — ABNORMAL LOW (ref 22–32)
Calcium: 8.5 mg/dL — ABNORMAL LOW (ref 8.9–10.3)
Chloride: 104 mmol/L (ref 98–111)
Creatinine, Ser: 0.54 mg/dL (ref 0.44–1.00)
GFR calc Af Amer: >60 mL/min (ref >60)
GFR calc non Af Amer: >60 mL/min (ref >60)
Glucose, Bld: 121 mg/dL — ABNORMAL HIGH (ref 70–99)
Potassium: 3.7 mmol/L (ref 3.5–5.1)
Sodium: 135 mmol/L (ref 135–145)
Total Bilirubin: 0.2 mg/dL — ABNORMAL LOW (ref 0.3–1.2)
Total Protein: 6.2 g/dL — ABNORMAL LOW (ref 6.5–8.1)

## 2019-11-06 LAB — PROTEIN / CREATININE RATIO, URINE
Creatinine, Urine: 257.24 mg/dL
Protein Creatinine Ratio: 0.09 mg/mg{Cre} (ref 0.00–0.15)
Total Protein, Urine: 23 mg/dL

## 2019-11-06 LAB — RPR: RPR Ser Ql: NONREACTIVE

## 2019-11-06 LAB — TYPE AND SCREEN
ABO/RH(D): O POS
Antibody Screen: NEGATIVE

## 2019-11-06 LAB — ABO/RH: ABO/RH(D): O POS

## 2019-11-06 LAB — SARS CORONAVIRUS 2 (TAT 6-24 HRS): SARS Coronavirus 2: NEGATIVE

## 2019-11-06 SURGERY — Surgical Case
Anesthesia: Epidural | Site: Abdomen | Wound class: Clean Contaminated

## 2019-11-06 MED ORDER — MORPHINE SULFATE (PF) 0.5 MG/ML IJ SOLN
INTRAMUSCULAR | Status: AC
  Filled 2019-11-06: qty 10

## 2019-11-06 MED ORDER — LACTATED RINGERS IV SOLN: 500.0000 mL | Freq: Once | INTRAVENOUS | Status: DC

## 2019-11-06 MED ORDER — PHENYLEPHRINE 40 MCG/ML (10ML) SYRINGE FOR IV PUSH (FOR BLOOD PRESSURE SUPPORT): 80.0000 ug | PREFILLED_SYRINGE | INTRAVENOUS | Status: DC | PRN

## 2019-11-06 MED ORDER — SIMETHICONE 80 MG PO CHEW: 80.0000 mg | CHEWABLE_TABLET | ORAL | Status: DC | PRN

## 2019-11-06 MED ORDER — FENTANYL CITRATE (PF) 100 MCG/2ML IJ SOLN
INTRAMUSCULAR | Status: DC | PRN
  Administered 2019-11-06: 100 ug via INTRAVENOUS

## 2019-11-06 MED ORDER — ZOLPIDEM TARTRATE 5 MG PO TABS: 5.0000 mg | ORAL_TABLET | Freq: Every evening | ORAL | Status: DC | PRN

## 2019-11-06 MED ORDER — FENTANYL-BUPIVACAINE-NACL 0.5-0.125-0.9 MG/250ML-% EP SOLN
12.0000 mL/h | EPIDURAL | Status: DC | PRN
  Filled 2019-11-06: qty 250

## 2019-11-06 MED ORDER — OXYTOCIN 40 UNITS IN NORMAL SALINE INFUSION - SIMPLE MED: 2.5000 [IU]/h | INTRAVENOUS | Status: AC

## 2019-11-06 MED ORDER — DIBUCAINE (PERIANAL) 1 % EX OINT: 1.0000 "application " | TOPICAL_OINTMENT | CUTANEOUS | Status: DC | PRN

## 2019-11-06 MED ORDER — ONDANSETRON HCL 4 MG/2ML IJ SOLN: 4.0000 mg | Freq: Four times a day (QID) | INTRAMUSCULAR | Status: DC | PRN

## 2019-11-06 MED ORDER — CLINDAMYCIN PHOSPHATE 900 MG/50ML IV SOLN
INTRAVENOUS | Status: AC
  Filled 2019-11-06: qty 50

## 2019-11-06 MED ORDER — MENTHOL 3 MG MT LOZG: 1.0000 | LOZENGE | OROMUCOSAL | Status: DC | PRN

## 2019-11-06 MED ORDER — LACTATED RINGERS IV SOLN: INTRAVENOUS | Status: DC

## 2019-11-06 MED ORDER — OXYTOCIN 40 UNITS IN NORMAL SALINE INFUSION - SIMPLE MED
1.0000 m[IU]/min | INTRAVENOUS | Status: DC
  Administered 2019-11-06: 08:00:00 2 m[IU]/min via INTRAVENOUS

## 2019-11-06 MED ORDER — KETOROLAC TROMETHAMINE 30 MG/ML IJ SOLN
INTRAMUSCULAR | Status: AC
  Filled 2019-11-06: qty 1

## 2019-11-06 MED ORDER — SIMETHICONE 80 MG PO CHEW
80.0000 mg | CHEWABLE_TABLET | ORAL | Status: DC
  Administered 2019-11-07 (×2): 80 mg via ORAL
  Filled 2019-11-06 (×2): qty 1

## 2019-11-06 MED ORDER — STERILE WATER FOR IRRIGATION IR SOLN
Status: DC | PRN
  Administered 2019-11-06: 1000 mL

## 2019-11-06 MED ORDER — PRENATAL MULTIVITAMIN CH
1.0000 | ORAL_TABLET | Freq: Every day | ORAL | Status: DC
  Administered 2019-11-07: 13:00:00 1 via ORAL
  Filled 2019-11-06: qty 1

## 2019-11-06 MED ORDER — IBUPROFEN 800 MG PO TABS
800.0000 mg | ORAL_TABLET | Freq: Four times a day (QID) | ORAL | Status: DC | PRN
  Administered 2019-11-07: 05:00:00 800 mg via ORAL
  Filled 2019-11-06: qty 1

## 2019-11-06 MED ORDER — OXYTOCIN BOLUS FROM INFUSION: 500.0000 mL | Freq: Once | INTRAVENOUS | Status: DC

## 2019-11-06 MED ORDER — LIDOCAINE HCL (PF) 1 % IJ SOLN: 30.0000 mL | INTRAMUSCULAR | Status: DC | PRN

## 2019-11-06 MED ORDER — BUPIVACAINE HCL (PF) 0.25 % IJ SOLN
INTRAMUSCULAR | Status: AC
  Filled 2019-11-06: qty 10

## 2019-11-06 MED ORDER — WITCH HAZEL-GLYCERIN EX PADS: 1.0000 "application " | MEDICATED_PAD | CUTANEOUS | Status: DC | PRN

## 2019-11-06 MED ORDER — COCONUT OIL OIL: 1.0000 "application " | TOPICAL_OIL | Status: DC | PRN

## 2019-11-06 MED ORDER — GENTAMICIN SULFATE 40 MG/ML IJ SOLN
5.0000 mg/kg | Freq: Once | INTRAVENOUS | Status: AC
  Administered 2019-11-06: 340 mg via INTRAVENOUS
  Filled 2019-11-06: qty 8.5

## 2019-11-06 MED ORDER — OXYCODONE HCL 5 MG PO TABS
5.0000 mg | ORAL_TABLET | ORAL | Status: DC | PRN
  Administered 2019-11-07 (×2): 5 mg via ORAL
  Administered 2019-11-08 (×2): 10 mg via ORAL
  Filled 2019-11-06: qty 1
  Filled 2019-11-06: qty 2
  Filled 2019-11-06: qty 1
  Filled 2019-11-06: qty 2

## 2019-11-06 MED ORDER — EPHEDRINE 5 MG/ML INJ: 10.0000 mg | INTRAVENOUS | Status: DC | PRN

## 2019-11-06 MED ORDER — DIPHENHYDRAMINE HCL 50 MG/ML IJ SOLN: 12.5000 mg | INTRAMUSCULAR | Status: DC | PRN

## 2019-11-06 MED ORDER — SOD CITRATE-CITRIC ACID 500-334 MG/5ML PO SOLN
30.0000 mL | ORAL | Status: DC | PRN
  Administered 2019-11-06: 10:00:00 30 mL via ORAL
  Filled 2019-11-06: qty 30

## 2019-11-06 MED ORDER — SODIUM CHLORIDE (PF) 0.9 % IJ SOLN
INTRAMUSCULAR | Status: DC | PRN
  Administered 2019-11-06: 12 mL/h via EPIDURAL

## 2019-11-06 MED ORDER — TERBUTALINE SULFATE 1 MG/ML IJ SOLN: 0.2500 mg | Freq: Once | INTRAMUSCULAR | Status: DC | PRN

## 2019-11-06 MED ORDER — OXYCODONE-ACETAMINOPHEN 5-325 MG PO TABS: 1.0000 | ORAL_TABLET | ORAL | Status: DC | PRN

## 2019-11-06 MED ORDER — FENTANYL CITRATE (PF) 100 MCG/2ML IJ SOLN
INTRAMUSCULAR | Status: AC
  Filled 2019-11-06: qty 2

## 2019-11-06 MED ORDER — SODIUM CHLORIDE 0.9 % IR SOLN
Status: DC | PRN
  Administered 2019-11-06: 1000 mL

## 2019-11-06 MED ORDER — ONDANSETRON HCL 4 MG/2ML IJ SOLN
INTRAMUSCULAR | Status: AC
  Filled 2019-11-06: qty 2

## 2019-11-06 MED ORDER — LACTATED RINGERS IV SOLN: INTRAVENOUS | Status: DC | PRN

## 2019-11-06 MED ORDER — PHENYLEPHRINE HCL-NACL 20-0.9 MG/250ML-% IV SOLN
INTRAVENOUS | Status: AC
  Filled 2019-11-06: qty 250

## 2019-11-06 MED ORDER — OXYTOCIN 40 UNITS IN NORMAL SALINE INFUSION - SIMPLE MED
INTRAVENOUS | Status: AC
  Filled 2019-11-06: qty 1000

## 2019-11-06 MED ORDER — MORPHINE SULFATE (PF) 0.5 MG/ML IJ SOLN
INTRAMUSCULAR | Status: DC | PRN
  Administered 2019-11-06: 3 ug via EPIDURAL

## 2019-11-06 MED ORDER — SENNOSIDES-DOCUSATE SODIUM 8.6-50 MG PO TABS
2.0000 | ORAL_TABLET | ORAL | Status: DC
  Administered 2019-11-07 (×2): 2 via ORAL
  Filled 2019-11-06 (×2): qty 2

## 2019-11-06 MED ORDER — DIPHENHYDRAMINE HCL 25 MG PO CAPS
25.0000 mg | ORAL_CAPSULE | Freq: Four times a day (QID) | ORAL | Status: DC | PRN
  Administered 2019-11-06 – 2019-11-07 (×2): 25 mg via ORAL
  Filled 2019-11-06 (×2): qty 1

## 2019-11-06 MED ORDER — LIDOCAINE-EPINEPHRINE (PF) 2 %-1:200000 IJ SOLN
INTRAMUSCULAR | Status: AC
  Filled 2019-11-06: qty 10

## 2019-11-06 MED ORDER — LIDOCAINE-EPINEPHRINE (PF) 2 %-1:200000 IJ SOLN
INTRAMUSCULAR | Status: DC | PRN
  Administered 2019-11-06: 1 mL via EPIDURAL
  Administered 2019-11-06: 3 mL via EPIDURAL
  Administered 2019-11-06: 2 mL via EPIDURAL
  Administered 2019-11-06: 5 mL via EPIDURAL
  Administered 2019-11-06: 2 mL via EPIDURAL
  Administered 2019-11-06: 1 mL via EPIDURAL

## 2019-11-06 MED ORDER — OXYTOCIN 10 UNIT/ML IJ SOLN
10.0000 [IU] | Freq: Once | INTRAMUSCULAR | Status: AC
  Administered 2019-11-06: 11:00:00 40 [IU] via INTRAMUSCULAR

## 2019-11-06 MED ORDER — KETOROLAC TROMETHAMINE 30 MG/ML IJ SOLN
30.0000 mg | Freq: Once | INTRAMUSCULAR | Status: AC | PRN
  Administered 2019-11-06: 30 mg via INTRAVENOUS

## 2019-11-06 MED ORDER — ONDANSETRON HCL 4 MG/2ML IJ SOLN
INTRAMUSCULAR | Status: DC | PRN
  Administered 2019-11-06: 4 mg via INTRAVENOUS

## 2019-11-06 MED ORDER — SIMETHICONE 80 MG PO CHEW
80.0000 mg | CHEWABLE_TABLET | Freq: Three times a day (TID) | ORAL | Status: DC
  Administered 2019-11-06 – 2019-11-08 (×4): 80 mg via ORAL
  Filled 2019-11-06 (×4): qty 1

## 2019-11-06 MED ORDER — OXYTOCIN 40 UNITS IN NORMAL SALINE INFUSION - SIMPLE MED
2.5000 [IU]/h | INTRAVENOUS | Status: DC
  Filled 2019-11-06: qty 1000

## 2019-11-06 MED ORDER — ACETAMINOPHEN 325 MG PO TABS: 650.0000 mg | ORAL_TABLET | ORAL | Status: DC | PRN

## 2019-11-06 MED ORDER — MORPHINE SULFATE (PF) 0.5 MG/ML IJ SOLN
INTRAMUSCULAR | Status: DC | PRN
  Administered 2019-11-06: 2 mg via INTRAVENOUS

## 2019-11-06 MED ORDER — CLINDAMYCIN PHOSPHATE 900 MG/50ML IV SOLN
900.0000 mg | Freq: Once | INTRAVENOUS | Status: AC
  Administered 2019-11-06: 900 mg via INTRAVENOUS

## 2019-11-06 MED ORDER — VANCOMYCIN HCL IN DEXTROSE 1-5 GM/200ML-% IV SOLN
1000.0000 mg | Freq: Two times a day (BID) | INTRAVENOUS | Status: DC
  Administered 2019-11-06: 07:00:00 1000 mg via INTRAVENOUS
  Filled 2019-11-06: qty 200

## 2019-11-06 MED ORDER — LACTATED RINGERS IV SOLN: 500.0000 mL | INTRAVENOUS | Status: DC | PRN

## 2019-11-06 MED ORDER — BUPIVACAINE HCL (PF) 0.25 % IJ SOLN
INTRAMUSCULAR | Status: DC | PRN
  Administered 2019-11-06: 10 mL

## 2019-11-06 MED ORDER — OXYCODONE-ACETAMINOPHEN 5-325 MG PO TABS: 2.0000 | ORAL_TABLET | ORAL | Status: DC | PRN

## 2019-11-06 MED ORDER — SODIUM CHLORIDE 0.9 % IV SOLN: INTRAVENOUS | Status: DC | PRN

## 2019-11-06 SURGICAL SUPPLY — 30 items
BARRIER ADHS 3X4 INTERCEED (GAUZE/BANDAGES/DRESSINGS) ×3 IMPLANT
BENZOIN TINCTURE PRP APPL 2/3 (GAUZE/BANDAGES/DRESSINGS) ×6 IMPLANT
CHLORAPREP W/TINT 26ML (MISCELLANEOUS) ×3 IMPLANT
CLAMP CORD UMBIL (MISCELLANEOUS) ×3 IMPLANT
CLOSURE WOUND 1/2 X4 (GAUZE/BANDAGES/DRESSINGS) ×2
CLOTH BEACON ORANGE TIMEOUT ST (SAFETY) ×3 IMPLANT
DRAPE C SECTION CLR SCREEN (DRAPES) ×3 IMPLANT
DRSG OPSITE POSTOP 4X10 (GAUZE/BANDAGES/DRESSINGS) ×3 IMPLANT
ELECT REM PT RETURN 9FT ADLT (ELECTROSURGICAL) ×3
ELECTRODE REM PT RTRN 9FT ADLT (ELECTROSURGICAL) ×1 IMPLANT
GLOVE BIOGEL PI IND STRL 7.0 (GLOVE) ×2 IMPLANT
GLOVE BIOGEL PI INDICATOR 7.0 (GLOVE) ×4
GLOVE ECLIPSE 6.5 STRL STRAW (GLOVE) ×3 IMPLANT
GOWN STRL REUS W/TWL LRG LVL3 (GOWN DISPOSABLE) ×6 IMPLANT
NEEDLE HYPO 22GX1.5 SAFETY (NEEDLE) ×3 IMPLANT
NS IRRIG 1000ML POUR BTL (IV SOLUTION) ×3 IMPLANT
PACK C SECTION WH (CUSTOM PROCEDURE TRAY) ×3 IMPLANT
PAD OB MATERNITY 4.3X12.25 (PERSONAL CARE ITEMS) ×3 IMPLANT
RTRCTR C-SECT PINK 25CM LRG (MISCELLANEOUS) ×3 IMPLANT
STRIP CLOSURE SKIN 1/2X4 (GAUZE/BANDAGES/DRESSINGS) ×4 IMPLANT
SUT MNCRL 0 VIOLET CTX 36 (SUTURE) ×3 IMPLANT
SUT MONOCRYL 0 CTX 36 (SUTURE) ×6
SUT PLAIN 2 0 XLH (SUTURE) ×3 IMPLANT
SUT VIC AB 0 CT1 36 (SUTURE) ×6 IMPLANT
SUT VIC AB 2-0 CT1 27 (SUTURE) ×2
SUT VIC AB 2-0 CT1 TAPERPNT 27 (SUTURE) ×1 IMPLANT
SUT VIC AB 4-0 KS 27 (SUTURE) ×3 IMPLANT
SYR CONTROL 10ML LL (SYRINGE) ×3 IMPLANT
TOWEL OR 17X24 6PK STRL BLUE (TOWEL DISPOSABLE) ×3 IMPLANT
WATER STERILE IRR 1000ML POUR (IV SOLUTION) ×3 IMPLANT

## 2019-11-06 NOTE — MAU Provider Note (Signed)
Chief Complaint:  Contractions   First Provider Initiated Contact with Patient 11/06/19 0400     HPI: Denise Fuentes is a 18 y.o. G1P0 at 70w5dwho presents to maternity admissions reporting contractions.  BP noted to be elevated on two values.  Denies headache or vision changes. . She reports good fetal movement, denies LOF, vaginal bleeding, vaginal itching/burning, urinary symptoms, h/a, dizziness, n/v, diarrhea, constipation or fever/chills.  She denies headache, visual changes or RUQ abdominal pain.  Abdominal Pain This is a new problem. The current episode started today. The problem occurs intermittently. The quality of the pain is cramping. Pertinent negatives include no constipation, diarrhea, dysuria, fever, headaches, myalgias, nausea or vomiting. Nothing aggravates the pain. The pain is relieved by nothing. She has tried nothing for the symptoms.  Hypertension This is a new problem. The current episode started in the past 7 days. The problem has been waxing and waning since onset. Associated symptoms include peripheral edema. Pertinent negatives include no blurred vision, headaches or malaise/fatigue. There are no associated agents to hypertension. There are no known risk factors for coronary artery disease. Past treatments include nothing. There are no compliance problems.     RN Note: Ctxs since 12noon. Denies LOF or vag bleeding. FT last sve  Past Medical History: Past Medical History:  Diagnosis Date  . Kidney stones     Past obstetric history: OB History  Gravida Para Term Preterm AB Living  1            SAB TAB Ectopic Multiple Live Births               # Outcome Date GA Lbr Len/2nd Weight Sex Delivery Anes PTL Lv  1 Current             Past Surgical History: Past Surgical History:  Procedure Laterality Date  . TONSILLECTOMY      Family History: Family History  Problem Relation Age of Onset  . Cancer Father   . Cancer Maternal Grandmother   . Healthy  Mother     Social History: Social History   Tobacco Use  . Smoking status: Former Smoker    Packs/day: 0.00    Types: Cigarettes  . Smokeless tobacco: Never Used  Substance Use Topics  . Alcohol use: Not Currently  . Drug use: Never    Allergies:  Allergies  Allergen Reactions  . Amoxapine And Related Anaphylaxis  . Amoxicillin Anaphylaxis  . Bee Venom Hives    Meds:  Medications Prior to Admission  Medication Sig Dispense Refill Last Dose  . Doxylamine-Pyridoxine 10-10 MG TBEC Take 1 tablet by mouth at bedtime. 30 tablet 0   . metoCLOPramide (REGLAN) 10 MG tablet Take 1 tablet (10 mg total) by mouth 4 (four) times daily. 30 tablet 2   . metroNIDAZOLE (METROGEL VAGINAL) 0.75 % vaginal gel Place 1 Applicatorful vaginally at bedtime. Insert one applicator, at bedtime, for 5 nights. 70 g 0   . ondansetron (ZOFRAN) 4 MG tablet Take 4 mg by mouth every 8 (eight) hours as needed for nausea or vomiting.     . pantoprazole (PROTONIX) 20 MG tablet Take 1 tablet (20 mg total) by mouth daily. 30 tablet 3   . promethazine (PHENERGAN) 25 MG tablet Take 25 mg by mouth every 6 (six) hours as needed for nausea or vomiting.       I have reviewed patient's Past Medical Hx, Surgical Hx, Family Hx, Social Hx, medications and allergies.   ROS:  Review  of Systems  Constitutional: Negative for fever and malaise/fatigue.  Eyes: Negative for blurred vision.  Gastrointestinal: Positive for abdominal pain. Negative for constipation, diarrhea, nausea and vomiting.  Genitourinary: Negative for dysuria.  Musculoskeletal: Negative for myalgias.  Neurological: Negative for headaches.   Other systems negative  Physical Exam   Patient Vitals for the past 24 hrs:  BP Temp Pulse Resp SpO2 Height Weight  11/06/19 0351 135/73 -- (!) 102 -- -- -- --  11/06/19 0350 (!) 148/85 -- 83 -- -- -- --  11/06/19 0346 (!) 142/83 -- 90 -- -- -- --  11/06/19 0329 128/76 -- 85 -- 99 % -- --  11/06/19 0326 -- 98  F (36.7 C) -- 20 -- 5\' 2"  (1.575 m) 94.3 kg   Constitutional: Well-developed, well-nourished female in no acute distress.  Cardiovascular: normal rate and rhythm Respiratory: normal effort, clear to auscultation bilaterally GI: Abd soft, non-tender, gravid appropriate for gestational age.   No rebound or guarding. MS: Extremities nontender, Tr-1+ edema, normal ROM Neurologic: Alert and oriented x 4. DTRs 2+ GU: Neg CVAT.  PELVIC EXAM:  Dilation: 2 Effacement (%): 90 Station: -1 Exam by:: [email or web address], RN  FHT:  Baseline 135 , moderate variability, accelerations present, no decelerations Contractions: q 2-5 mins Irregular     Labs: Preeclampsia labs ordered    Imaging:  No results found.  MAU Course/MDM: I have ordered labs for preeclampsia evaluation NST reviewed, reactive, category I  Assessment: Single intrauterine pregnancy at [redacted]w[redacted]d Elevated systolic blood pressures Latent phase labor  Plan: While I was waiting for lab results, patient progressed in her labor and was admitted  [redacted]w[redacted]d CNM, MSN Certified Nurse-Midwife 11/06/2019 4:00 AM

## 2019-11-06 NOTE — Brief Op Note (Addendum)
11/06/2019  11:50 AM  PATIENT:  Denise Fuentes  18 y.o. female  PRE-OPERATIVE DIAGNOSIS:  nonreassuring fetal heart tracing, term gestation, gestational HTN  POST-OPERATIVE DIAGNOSIS:  nonreassuring fetal heart tracing, term gestation, gestational HTN  PROCEDURE:  Primary cesarean section, kerr hysterotomy  SURGEON:  Surgeon(s) and Role:    * Servando Salina, MD - Primary  PHYSICIAN ASSISTANT:   ASSISTANTS: Derrell Lolling, CNM   ANESTHESIA:   epidural FINDINGS; live female CANx 1. Nl tubes and ovaries EBL:  864 ml   BLOOD ADMINISTERED:none  DRAINS: none   LOCAL MEDICATIONS USED:  MARCAINE     SPECIMEN:  Source of Specimen:  placenta  DISPOSITION OF SPECIMEN:  N/A  COUNTS:  YES  TOURNIQUET:  * No tourniquets in log *  DICTATION: .Other Dictation: Dictation Number [id number]  PLAN OF CARE: Admit to inpatient   PATIENT DISPOSITION:  PACU - hemodynamically stable.   Delay start of Pharmacological VTE agent (>24hrs) due to surgical blood loss or risk of bleeding: no

## 2019-11-06 NOTE — Progress Notes (Signed)
Called  BY RN while in OR for repetitive late deceleration with ctxs. Pitocin off  pt has SROM clear fluid (+) GBS (+) IV Vancomycin  Covid-19 status unknown Tracing reviewed: baseline 150 (+) decels to 120 lasting 2-3 mins.  With pitocin off, variables noted with baseline increased to 160 now ctx spacing and variability better Waiting for Covid-19 OR which is in use  Pt informed of the need for C/S. Procedure explained. Risk of surgery reviewed including infection, bleeding, injury to bladder, bowel, ureters, internal scar tissue, possible need for blood transfusion  And its risk( Hiv, acute rxn, hepatitis, thermal injury. All ? answered

## 2019-11-06 NOTE — MAU Note (Signed)
Ctxs since 12noon. Denies LOF or vag bleeding. FT last sve

## 2019-11-06 NOTE — Progress Notes (Signed)
Pharmacy Antibiotic Note  Denise Fuentes is a 18 y.o. female admitted on 11/06/2019 @ 39 5/[redacted] weeks gestation in early labor .  Pharmacy has been consulted for Gentamicin dosing for surgical prophylaxis prior to C/S.  Plan: Gentamicin 5mg /kg 340mg  IV x 1  Height: 5\' 2"  (157.5 cm) Weight: 207 lb 14.3 oz (94.3 kg) IBW/kg (Calculated) : 50.1  ABW=67.8kg  Temp (24hrs), Avg:98.3 F (36.8 C), Min:97.7 F (36.5 C), Max:99.4 F (37.4 C)    Estimated Creatinine Clearance: 120.1 mL/min (by C-G formula based on SCr of 0.54 mg/dL).    Allergies  Allergen Reactions  . Amoxapine And Related Anaphylaxis  . Amoxicillin Anaphylaxis  . Bee Venom Hives     Thank you for allowing pharmacy to be a part of this patient's care.  Hovey-Rankin, Tatsuo Musial 11/06/2019 12:44 PM

## 2019-11-06 NOTE — Progress Notes (Signed)
DR Garwin Brothers returned page. Aware of pt's admission and status to include ctx pattern, sve x2, GBS positive and Vancomycin in labor, pt very uncomfortable with ctxs, some elevated b/ps and cbc result. To call MD back when rest of labs result.

## 2019-11-06 NOTE — Transfer of Care (Signed)
Immediate Anesthesia Transfer of Care Note  Patient: Denise Fuentes  Procedure(s) Performed: CESAREAN SECTION (N/A Abdomen)  Patient Location: PACU  Anesthesia Type:Epidural  Level of Consciousness: awake, alert  and oriented  Airway & Oxygen Therapy: Patient Spontanous Breathing and Patient connected to nasal cannula oxygen  Post-op Assessment: Report given to RN and Post -op Vital signs reviewed and stable  Post vital signs: Reviewed and stable  Last Vitals:  Vitals Value Taken Time  BP 115/71 11/06/19 1153  Temp    Pulse 111 11/06/19 1157  Resp 19 11/06/19 1157  SpO2 100 % 11/06/19 1157  Vitals shown include unvalidated device data.  Last Pain:  Vitals:   11/06/19 1031  TempSrc:   PainSc: 0-No pain      Patients Stated Pain Goal: 0 (02/01/21 4825)  Complications: No apparent anesthesia complications

## 2019-11-06 NOTE — Progress Notes (Signed)
Dr Garwin Brothers called unit and saw resulted labs. Will admit pt to [facility] and orders put in by MD. Pt aware.

## 2019-11-06 NOTE — Anesthesia Procedure Notes (Signed)

## 2019-11-06 NOTE — Anesthesia Preprocedure Evaluation (Addendum)
Anesthesia Evaluation  Patient identified by MRN, date of birth, ID band Patient awake    Reviewed: Allergy & Precautions, NPO status , Patient's Chart, lab work & pertinent test results  Airway Mallampati: II  TM Distance: >3 FB Neck ROM: Full    Dental no notable dental hx.    Pulmonary neg pulmonary ROS, former smoker,    Pulmonary exam normal breath sounds clear to auscultation       Cardiovascular hypertension (gHTN), Normal cardiovascular exam Rhythm:Regular Rate:Normal     Neuro/Psych negative neurological ROS  negative psych ROS   GI/Hepatic negative GI ROS, Neg liver ROS,   Endo/Other  negative endocrine ROS  Renal/GU negative Renal ROS  negative genitourinary   Musculoskeletal negative musculoskeletal ROS (+)   Abdominal   Peds  Hematology negative hematology ROS (+) Hgb 10.7 Plt 121 T & S avail   Anesthesia Other Findings   Reproductive/Obstetrics (+) Pregnancy                            Anesthesia Physical Anesthesia Plan  ASA: III  Anesthesia Plan: Epidural   Post-op Pain Management:    Induction:   PONV Risk Score and Plan: Treatment may vary due to age or medical condition  Airway Management Planned: Natural Airway  Additional Equipment:   Intra-op Plan:   Post-operative Plan:   Informed Consent: I have reviewed the patients History and Physical, chart, labs and discussed the procedure including the risks, benefits and alternatives for the proposed anesthesia with the patient or authorized representative who has indicated his/her understanding and acceptance.       Plan Discussed with: Anesthesiologist  Anesthesia Plan Comments: (Patient identified. Risks, benefits, options discussed with patient including but not limited to bleeding, infection, nerve damage, paralysis, failed block, incomplete pain control, headache, blood pressure changes, nausea,  vomiting, reactions to medication, itching, and post partum back pain. Confirmed with bedside nurse the patient's most recent platelet count. Confirmed with the patient that they are not taking any anticoagulation, have any bleeding history or any family history of bleeding disorders. Patient expressed understanding and wishes to proceed. All questions were answered. )        Anesthesia Quick Evaluation

## 2019-11-06 NOTE — H&P (Addendum)
Denise Fuentes is a 18 y.o. female presenting @ 39 5/[redacted] week gestation in early labor. Pt was noted to have elevated BP in MAU.  PIH labs were normal. (+) GBS  OB History    Gravida  1   Para      Term      Preterm      AB      Living        SAB      TAB      Ectopic      Multiple      Live Births             Past Medical History:  Diagnosis Date  . Kidney stones    Past Surgical History:  Procedure Laterality Date  . TONSILLECTOMY     Family History: family history includes Cancer in her father and maternal grandmother; Healthy in her mother. Social History:  reports that she has quit smoking. Her smoking use included cigarettes. She smoked 0.00 packs per day. She has never used smokeless tobacco. She reports previous alcohol use. She reports that she does not use drugs.     Maternal Diabetes: No Genetic Screening: Normal Maternal Ultrasounds/Referrals: Isolated EIF (echogenic intracardiac focus) Fetal Ultrasounds or other Referrals:  None Maternal Substance Abuse:  No Significant Maternal Medications:  Meds include: Other: pepcid Significant Maternal Lab Results:  Group B Strep positive Other Comments:  HEG 1st trim  Review of Systems  Cardiovascular: Positive for leg swelling.  Gastrointestinal: Negative for nausea.  Neurological: Negative for headaches.  All other systems reviewed and are negative.  Maternal Medical History:  Reason for admission: Nausea.    Dilation: 4 Effacement (%): 100 Station: -2 Exam by:: E. Rothermel RN  Blood pressure 119/65, pulse 77, temperature 97.7 F (36.5 C), temperature source Oral, resp. rate 18, height 5\' 2"  (1.575 m), weight 94.3 kg, last menstrual period 02/11/2019, SpO2 99 %. Exam Physical Exam  Constitutional: She is oriented to person, place, and time. She appears well-developed and well-nourished.  HENT:  Head: Atraumatic.  Eyes: EOM are normal.  Cardiovascular: Regular rhythm.  Respiratory: Effort  normal.  GI: Soft.  Musculoskeletal:        General: Edema present.     Cervical back: Neck supple.  Neurological: She is alert and oriented to person, place, and time.  Skin: Skin is warm and dry.  Psychiatric: She has a normal mood and affect.    Prenatal labs: ABO, Rh: --/--/O POS, O POS Performed at [facility], [place] [address] [place], [place] [address]  [phone]) Antibody: NEG (12/23 0428) Rubella:  Immune RPR:   NR HBsAg:   neg HIV:   neg GBS:  positive   Assessment/Plan: Gestational HTN Early labor Term gestation P) admit IV vancomycin. Epidural  Amniotomy prn. start pitocin augmentation   Paiden Cavell A Benjamyn Hestand 11/06/2019, 8:01 AM

## 2019-11-06 NOTE — Anesthesia Postprocedure Evaluation (Signed)
Anesthesia Post Note  Patient: Denise Fuentes  Procedure(s) Performed: CESAREAN SECTION (N/A Abdomen)     Patient location during evaluation: Mother Baby Anesthesia Type: Epidural Level of consciousness: oriented and awake and alert Pain management: pain level controlled Vital Signs Assessment: post-procedure vital signs reviewed and stable Respiratory status: spontaneous breathing and respiratory function stable Cardiovascular status: blood pressure returned to baseline and stable Postop Assessment: no headache, no backache, no apparent nausea or vomiting and able to ambulate Anesthetic complications: no    Last Vitals:  Vitals:   11/06/19 1230 11/06/19 1245  BP: 104/75 104/64  Pulse: 96 (!) 105  Resp: 16 16  Temp: 37 C   SpO2: 100% 100%    Last Pain:  Vitals:   11/06/19 1245  TempSrc:   PainSc: 0-No pain   Pain Goal: Patients Stated Pain Goal: 0 (11/06/19 0328)              Epidural/Spinal Function Cutaneous sensation: Vague (11/06/19 1245), Patient able to flex knees: Yes (11/06/19 1245), Patient able to lift hips off bed: Yes (11/06/19 1245), Back pain beyond tenderness at insertion site: No (11/06/19 1245), Progressively worsening motor and/or sensory loss: No (11/06/19 1245), Bowel and/or bladder incontinence post epidural: No (11/06/19 1245)  Barnet [place]

## 2019-11-06 NOTE — Lactation Note (Signed)
This note was copied from a baby's chart. Lactation Consultation Note  Patient Name: Denise Fuentes KGYJE'H Date: 11/06/2019 Reason for consult: Primapara;1st time breastfeeding;Initial assessment;Term  88 hours old FT female who is being exclusively BF by her mother, she's a P1. Mom reported moderate breast changes during the pregnancy (just pigmentation) she didn't notice her breasts getting much bigger. When assisting with hand expression noticed that mom has wide spaced breast but she was able to hand express big drops of colostrum with LC assistance and also on her own, she did teach back, praised her for her efforts. She participated in the [facility] program at the [facility]. She has a Medela DEBP at home.   Offered assistance with latch and mom agreed to wake baby up to feed, she said she tried to wake her up earlier without much success. LC took baby STS to mother's right breast in cross cradle position and she was able to latch after a couple of tried, the first two attempts were not deep enough. After she did, she only suck for a brief moment and then stopped, she'd so a few sucks on and off before falling asleep at the breast. An attempt was documented in Flowsheets.   Parents had a pacifier on baby's crib. Discussed how artificial nipples and pacifier use can hurt BF. Reviewed normal newborn behavior, size of baby's stomach, cluster feeding and feeding cues.   Feeding plan:  1. Encouraged mom to feed baby STS 8-12 times/24 hours or sooner if feeding cues are present 2. Hand expression and spoon feeding were also encouraged  BF brochure, BF resources and feeding diary were reviewed. Dad present and supportive. Parents reported all questions and concerns were answered, they're both aware of [place] OP services and will call PRN.   Maternal Data Formula Feeding for Exclusion: No Has patient been taught Hand Expression?: Yes Does the patient have breastfeeding experience prior to this delivery?:  No  Feeding Feeding Type: Breast Fed  LATCH Score                   Interventions Interventions: Breast feeding basics reviewed;Assisted with latch;Skin to skin;Breast massage;Hand express;Breast compression;Adjust position;Support pillows  Lactation Tools Discussed/Used WIC Program: Yes   Consult Status Consult Status: Follow-up Date: 11/07/19 Follow-up type: In-patient    Eero Dini Francene Boyers 11/06/2019, 8:43 PM

## 2019-11-06 NOTE — MAU Note (Signed)
Covid swab obtained without difficulty and pt tol ok. No symptoms 

## 2019-11-07 LAB — CBC
HCT: 24.3 % — ABNORMAL LOW (ref 36.0–46.0)
Hemoglobin: 8.2 g/dL — ABNORMAL LOW (ref 12.0–15.0)
MCH: 27.9 pg (ref 26.0–34.0)
MCHC: 33.7 g/dL (ref 30.0–36.0)
MCV: 82.7 fL (ref 80.0–100.0)
Platelets: 215 10*3/uL (ref 150–400)
RBC: 2.94 MIL/uL — ABNORMAL LOW (ref 3.87–5.11)
RDW: 14.2 % (ref 11.5–15.5)
WBC: 16.5 10*3/uL — ABNORMAL HIGH (ref 4.0–10.5)
nRBC: 0 % (ref 0.0–0.2)

## 2019-11-07 LAB — BIRTH TISSUE RECOVERY COLLECTION (PLACENTA DONATION)

## 2019-11-07 MED ORDER — SODIUM CHLORIDE 0.9 % IV SOLN
510.0000 mg | Freq: Once | INTRAVENOUS | Status: AC
  Administered 2019-11-07: 510 mg via INTRAVENOUS
  Filled 2019-11-07: qty 17

## 2019-11-07 MED ORDER — NALBUPHINE HCL 10 MG/ML IJ SOLN: 5.0000 mg | INTRAMUSCULAR | Status: DC | PRN

## 2019-11-07 MED ORDER — MAGNESIUM OXIDE 400 (241.3 MG) MG PO TABS: 400.0000 mg | ORAL_TABLET | Freq: Every day | ORAL | Status: DC

## 2019-11-07 MED ORDER — POLYSACCHARIDE IRON COMPLEX 150 MG PO CAPS
150.0000 mg | ORAL_CAPSULE | Freq: Every day | ORAL | Status: DC
  Administered 2019-11-07: 13:00:00 150 mg via ORAL
  Filled 2019-11-07: qty 1

## 2019-11-07 MED ORDER — NALOXONE HCL 4 MG/10ML IJ SOLN
1.0000 ug/kg/h | INTRAVENOUS | Status: DC | PRN
  Filled 2019-11-07: qty 5

## 2019-11-07 MED ORDER — ACETAMINOPHEN 500 MG PO TABS
1000.0000 mg | ORAL_TABLET | Freq: Four times a day (QID) | ORAL | Status: DC | PRN
  Administered 2019-11-07 – 2019-11-08 (×4): 1000 mg via ORAL
  Filled 2019-11-07 (×4): qty 2

## 2019-11-07 MED ORDER — DIPHENHYDRAMINE HCL 50 MG/ML IJ SOLN: 12.5000 mg | INTRAMUSCULAR | Status: DC | PRN

## 2019-11-07 MED ORDER — DIPHENHYDRAMINE HCL 25 MG PO CAPS: 25.0000 mg | ORAL_CAPSULE | ORAL | Status: DC | PRN

## 2019-11-07 MED ORDER — NALBUPHINE HCL 10 MG/ML IJ SOLN: 5.0000 mg | Freq: Once | INTRAMUSCULAR | Status: DC | PRN

## 2019-11-07 MED ORDER — IBUPROFEN 800 MG PO TABS
800.0000 mg | ORAL_TABLET | Freq: Four times a day (QID) | ORAL | Status: DC
  Administered 2019-11-07 – 2019-11-08 (×4): 800 mg via ORAL
  Filled 2019-11-07 (×4): qty 1

## 2019-11-07 MED ORDER — ONDANSETRON HCL 4 MG/2ML IJ SOLN: 4.0000 mg | Freq: Three times a day (TID) | INTRAMUSCULAR | Status: DC | PRN

## 2019-11-07 MED ORDER — SCOPOLAMINE 1 MG/3DAYS TD PT72: 1.0000 | MEDICATED_PATCH | Freq: Once | TRANSDERMAL | Status: DC

## 2019-11-07 MED ORDER — NALOXONE HCL 0.4 MG/ML IJ SOLN: 0.4000 mg | INTRAMUSCULAR | Status: DC | PRN

## 2019-11-07 MED ORDER — SODIUM CHLORIDE 0.9% FLUSH: 3.0000 mL | INTRAVENOUS | Status: DC | PRN

## 2019-11-07 NOTE — Lactation Note (Signed)
This note was copied from a baby's chart. Lactation Consultation Note  Patient Name: Denise Fuentes [id number] Date: 11/07/2019  Returned to assist with hand expression and spoon feeding.  Mom able to demo hand expression easily.  Mom has wide spaced less rounded breasts.  Did not palpate breats while mom hand expressing.  Mom fed back approximately 3 ml to infant via spoon. Urged mom to hand express and spoon feed past breastsfeeding as an Set designer infant getting enough while she is learning.  Urged to feed on cue and 8 or more times day. Infant has had adequate voids and stools since birth.Weight loss 5 percent    Reviewed how to know your baby is getting enough.  ugred parents to call lactation as neeeded    Maternal Data    Feeding Feeding Type: Breast Fed  LATCH Score Latch: Repeated attempts needed to sustain latch, nipple held in mouth throughout feeding, stimulation needed to elicit sucking reflex.  Audible Swallowing: A few with stimulation  Type of Nipple: Everted at rest and after stimulation  Comfort (Breast/Nipple): Soft / non-tender  Hold (Positioning): Assistance needed to correctly position infant at breast and maintain latch.  LATCH Score: 7  Interventions    Lactation Tools Discussed/Used     Consult Status      [name] 11/07/2019, 2:18 PM

## 2019-11-07 NOTE — Op Note (Signed)
[name], Denise Fuentes MEDICAL RECORD [id number] ACCOUNT [id number] DATE OF BIRTH:04-16-2001 FACILITY: [facility] LOCATION: [facility] PHYSICIAN:Emilene Roma A. Deshanti Adcox, MD  OPERATIVE REPORT  DATE OF PROCEDURE:  11/06/2019  PREOPERATIVE DIAGNOSES:   1.  Nonreassuring fetal tracing.   2.  Term gestation 3.  Gestational hypertension.  PROCEDURES:   1.  Primary cesarean section, Kerr hysterotomy.  POSTOPERATIVE DIAGNOSES:   1.  Nonreassuring fetal tracing.   2.  Term gestation 3.  Gestational hypertension.  ANESTHESIA:  Epidural.  SURGEON:  Servando Salina, MD  ASSISTANT:  Derrell Lolling, CNM.  DESCRIPTION OF PROCEDURE:  Under adequate epidural anesthesia, the patient was placed in the supine position with a left lateral tilt.  An indwelling Foley catheter was already in place.  Marcaine 0.25% was injected along the planned Pfannenstiel skin  incision site.  A Pfannenstiel skin incision was then made, carried down to the rectus fascia.  Rectus fascia was opened transversely.  Rectus fascia was then bluntly and sharply dissected off the rectus muscle in a superior and inferior fashion.  The  rectus muscle was split in the midline.  The parietal peritoneum was entered bluntly, sharply and extended.  A self-retaining Alexis retractor was then placed.  Vesicouterine peritoneum was opened transversely.  The bladder was displaced inferiorly with  blunt dissection.  A curvilinear low transverse incision was then made and extended bluntly in a cephalic and caudad manner.  A loop of cord was noted.  Subsequent delivery of a live female from the left occiput transverse position with a nuchal cord  removed and delayed cord clamp x1 minute was accomplished.  Cord was clamped, cut, the baby was transferred to the awaiting pediatricians, who assigned Apgars of 8 and 9 at 1 and 5 minutes respectively.  Placenta was manually removed.  Uterine cavity was cleaned of  debris.  Uterine incision had no extension.   The incision was closed in 2 layers, the first layer of 0 Monocryl running lock stitch, second layer was imbricating using 0 Monocryl suture.  Bleeding from the left angle resulted in [place] stitch x2 being  placed and peritoneal bleeding was cauterized.  The left ovary had adherence to the left retroperitoneal space and that was carefully dissected off that area.  Normal tubes and ovaries otherwise were noted.  The abdomen was irrigated and suctioned of  debris.  Interceed was placed in an inverted T fashion.  Alexis retractor was removed.  The parietal peritoneum was closed with 2-0 Vicryl.  The rectus fascia was closed with 0 Vicryl x2.  The subcutaneous area was irrigated, small bleeders cauterized.   Interrupted 2-0 plain sutures placed and the skin approximated using 4-0 Vicryl subcuticular closure.  Steri-Strips and benzoin was placed.  SPECIMEN:  Placenta, not sent to pathology.  ESTIMATED BLOOD LOSS:  815 mL.  INTRAOPERATIVE FLUIDS:  1000 ml.  URINE OUTPUT:  50 mL.  COMPLICATIONS:  None.  DISPOSITION:  The patient tolerated the procedure well and was transferred to the recovery room in stable condition.  VN/NUANCE  D:11/06/2019 T:11/06/2019 JOB:[id number]

## 2019-11-07 NOTE — Progress Notes (Signed)
POSTOPERATIVE DAY # 1 S/P Primary LTCS for non-reassuring fetal tracing, baby girl "[place]"   S:         Reports feeling okay, tired, having more pain today, some relief with Motrin and Oxycodone             Tolerating po intake / no nausea / no vomiting / minimal flatus / no BM  Denies dizziness, SOB, or CP             Bleeding is light             Up ad lib / ambulatory/ void x 1 since foley removal this morning without difficulty   Newborn breast feeding - reports going well; seeing colostrum; latching well    O:  VS: BP 102/68 (BP Location: Left Arm)   Pulse 86   Temp 98.4 F (36.9 C) (Oral)   Resp 17   Ht 5\' 2"  (1.575 m)   Wt 94.3 kg   LMP 02/11/2019 (Approximate)   SpO2 99%   Breastfeeding Unknown   BMI 38.02 kg/m  11/07/19 0528  98.4 F (36.9 C)  86  --  17  102/68  Semi-fowlers  --  --  --  --  -- EB   11/07/19 0211  98.3 F (36.8 C)  87  --  18  106/67  Semi-fowlers  --  --  --  --  -- EB   11/06/19 2225  97.9 F (36.6 C)  89  --  18  112/56Abnormal   Sitting  --  --  --  --  -- EB   11/06/19 1550  97.9 F (36.6 C)  91  --  16  104/57Abnormal   --  99 %  --  --  --  -- TC   11/06/19 1305  97.8 F (36.6 C)  83  --  16  100/51Abnormal   Semi-fowlers  100 %  --  --  --  -- TC   11/06/19 1253  --  --  97  12  --  --  --  --  --  --  -- ER   11/06/19 1252  --  --  83  21Abnormal   --  --  --  --  --  --  -- ER   11/06/19 1251  --  --  102Abnormal   17  --  --  --  --  --  --  -- ER   11/06/19 1250  --  93  107Abnormal   26Abnormal   --  --  100 %  --  --  --  -- ER   11/06/19 1249  --  96  --  --  --  --  --  --  --  --  -- ER   11/06/19 1248  --  103Abnormal   110Abnormal   14  --  --  99 %  --  --  --  -- ER   11/06/19 1247  --  93  94  14  --  --  100 %  --  --  --  -- ER   11/06/19 1246  --  102Abnormal   99  15  --  --  100 %  --  --  --  -- ER   11/06/19 1245  --  105Abnormal   104Abnormal   16  104/64  --  100 %  --  --  --  -- ER   11/06/19 1230  98.6 F (37  C)  96  100  16  104/75  --  100 %  --  --  --  -- ER   11/06/19 1215  --  101Abnormal   100  19  110/66  --  100 %  --  --  --  -- ER   11/06/19 1200  --  108Abnormal   107Abnormal   13  96/66  --  100 %  --  --  --  -- ER   11/06/19 1153  99.4 F (37.4 C)  110Abnormal   113Abnormal   16  115/71  --  100 %  --  --  --  -- ER   11/06/19 1152  --  115Abnormal   --  --  --  --  100 %  --  --  --  -- ER      LABS:               Results for orders placed or performed during the hospital encounter of 11/06/19 (from the past 24 hour(s))  CBC     Status: Abnormal   Collection Time: 11/06/19  1:22 PM  Result Value Ref Range   WBC 28.0 (H) 4.0 - 10.5 K/uL   RBC 3.31 (L) 3.87 - 5.11 MIL/uL   Hemoglobin 9.0 (L) 12.0 - 15.0 g/dL   HCT 16.127.4 (L) 09.636.0 - 04.546.0 %   MCV 82.8 80.0 - 100.0 fL   MCH 27.2 26.0 - 34.0 pg   MCHC 32.8 30.0 - 36.0 g/dL   RDW 40.913.9 81.111.5 - 91.415.5 %   Platelets 283 150 - 400 K/uL   nRBC 0.0 0.0 - 0.2 %  Collect bld for placenta donatation     Status: None   Collection Time: 11/07/19  6:03 AM  Result Value Ref Range   Placenta donation bld collect Collected by Laboratory   CBC     Status: Abnormal   Collection Time: 11/07/19  6:03 AM  Result Value Ref Range   WBC 16.5 (H) 4.0 - 10.5 K/uL   RBC 2.94 (L) 3.87 - 5.11 MIL/uL   Hemoglobin 8.2 (L) 12.0 - 15.0 g/dL   HCT 78.224.3 (L) 95.636.0 - 21.346.0 %   MCV 82.7 80.0 - 100.0 fL   MCH 27.9 26.0 - 34.0 pg   MCHC 33.7 30.0 - 36.0 g/dL   RDW 08.614.2 57.811.5 - 46.915.5 %   Platelets 215 150 - 400 K/uL   nRBC 0.00 0.0 - 0.2 %               Bloodtype: --/--/O POS, O POS Performed at [facility], [address] [place], [place] [address]  [phone](12/23 0428)  Rubella:                                               I&O: Intake/Output      12/23 0701 - 12/24 0700 12/24 0701 - 12/25 0700   P.O.  300   I.V. (mL/kg) 1200 (12.7)    IV Piggyback 58.5    Total Intake(mL/kg) 1258.5 (13.3) 300 (3.2)   Urine (mL/kg/hr) 1250 (0.6) 575 (1.8)   Blood 864     Total Output 2114 575   Net -855.5 -275          Flu and Tdap UTD  Physical Exam:             Alert and Oriented X3  Lungs: Clear and unlabored  Heart: regular rate and rhythm / no murmurs  Abdomen: soft, non-tender, mild gaseous distention, active bowel sounds in all quadrants              Fundus: firm, non-tender, U-1             Dressing: honeycomb dressing with steri-strips c/d/i              Incision:  approximated with sutures / no erythema / no ecchymosis / no drainage  Perineum: intact  Lochia: small rubra noted on pad   Extremities: +1 LE edema, no calf pain or tenderness, non-pitting upper extremity edema noted   A/P:     POD # 1 S/P Primary LTCS            Gestational hypertension on admission   - Labs WNL   - BPs WNL since delivery   - No neural s/s or evidence of PEC  ABL Anemia compounding chronic IDA    - Feraheme IVPB x 1 dose, then begin Niferex 150mg  PO daily tomorrow   - Magnesium oxide 400mg  PO daily   Ambulation and warm liquids to promote bowel motility   Rest when baby rests   Routine postoperative care              Add Tylenol 1000mg  every 6 hrs PRN for breakthrough pain and modify Motrin to scheduled doses  Continue working with lactation   D/C epidural catheter   May shower today   Continue current care     , MSN, CNM [facility]

## 2019-11-07 NOTE — Lactation Note (Addendum)
This note was copied from a baby's chart. Lactation Consultation Note  Patient Name: Girl Lynn Recendiz [id number] Date: 11/07/2019  Baby girl Gaumer now 22 hours old.  Parents concerned regarding her hands shaking.  It has not been long since she fed so discussed with parents doing some hand expression and spoon feeding.  Parents in agreement. Physician came in to exam infant.  Told parents I would return to room in a few minutes to assist.   Maternal Data    Feeding Feeding Type: Breast Fed  LATCH Score Latch: Repeated attempts needed to sustain latch, nipple held in mouth throughout feeding, stimulation needed to elicit sucking reflex.  Audible Swallowing: A few with stimulation  Type of Nipple: Everted at rest and after stimulation  Comfort (Breast/Nipple): Soft / non-tender  Hold (Positioning): Assistance needed to correctly position infant at breast and maintain latch.  LATCH Score: 7  Interventions    Lactation Tools Discussed/Used     Consult Status      Makinsley Schiavi Thompson Caul 11/07/2019, 2:13 PM

## 2019-11-07 NOTE — Clinical Social Work Maternal (Signed)
CLINICAL SOCIAL WORK MATERNAL/CHILD NOTE  Patient Details  Name: Denise Fuentes MRN: [id number] Date of Birth: 05/14/2001  Date:  2019-07-24  Clinical Social Worker Initiating Note:  Elijio Miles Date/Time: Initiated:  11/07/19/0924     Child's Name:  Denise Fuentes   Biological Parents:  Mother, Denise Fuentes and Denise Fuentes DOB: 05/11/1995)   Need for Interpreter:  None   Reason for Referral:  Current Substance Use/Substance Use During Pregnancy    Address:  [address] [place] [address]    Phone number:  [phone] (home)     Additional phone number:   Household Members/Support Persons (HM/SP):   Household Member/Support Person 1   HM/SP Name Relationship DOB or Age  HM/SP -1 Denise Fuentes FOB 05/11/1995  HM/SP -2        HM/SP -3        HM/SP -4        HM/SP -5        HM/SP -6        HM/SP -7        HM/SP -8          Natural Supports (not living in the home):  Parent   Professional Supports: None   Employment: Unemployed   Type of Work:     Education:  Programmer, systems   Homebound arranged:    Museum/gallery curator Resources:  Medicaid   Other Resources:  Physicist, medical , [place] Considerations Which May Impact Care:    Strengths:  Ability to meet basic needs , Home prepared for child , Pediatrician chosen   Psychotropic Medications:         Pediatrician:       Pediatrician List:   [place]      Pediatrician Fax Number:    Risk Factors/Current Problems:  Substance Use    Cognitive State:  Able to Concentrate , Alert , Linear Thinking    Mood/Affect:  Interested , Calm , Comfortable , Relaxed    CSW Assessment:  CSW received consult for THC use during pregnancy.  CSW met with MOB to offer support and complete assessment.    MOB sitting up in bed holding infant with FOB laying on couch, when  CSW entered the room. CSW introduced self and received verbal permission to complete assessment with FOB present. CSW explained reason for consult to which MOB expressed understanding. Both MOB and FOB were pleasant and engaged throughout assessment. MOB noted to be well-bonded to infant and attentive to cues. MOB reported she and FOB currently live together in [place]. MOB confirmed she receives both [facility] and food stamps and intends to follow up with both to update them of her delivery. CSW inquired about MOB's mental health history and MOB denied having any and reported being aware of PMADs. CSW provided education regarding the baby blues period vs. perinatal mood disorders. CSW recommended self-evaluation during the postpartum time period using the New Mom Checklist from Postpartum Progress and encouraged MOB to contact a medical professional if symptoms are noted at any time. MOB did not appear to be displaying any acute mental health symptoms and denied any current SI or HI. MOB reported feeling well supported by FOB, FOB's mom and MOB's mom.   CSW inquired about MOB's substance use history and MOB acknowledged using marijuana in the form of  edibles to help with her appetite and nausea. MOB shared she was getting to the point where she was losing weight and not gaining it and the edibles helped. MOB unable to remember when the last time was. CSW informed MOB of Hospital Drug Policy and explained UDS and CDS were still pending but that a CPS report would be made, if warranted. MOB and FOB denied any questions regarding policy.   MOB and FOB confirmed having all essential items for infant once discharged and confirmed infant had their own safe place to sleep once home. CSW provided review of Sudden Infant Death Syndrome (SIDS) precautions and safe sleeping habits.     CSW Plan/Description:  No Further Intervention Required/No Barriers to Discharge, Sudden Infant Death Syndrome (SIDS) Education,  Perinatal Mood and Anxiety Disorder (PMADs) Education, [place], CSW Will Continue to Monitor Umbilical Cord Tissue Drug Screen Results and Make Report if Foye Spurling, LCSW 2019-02-07, 9:51 AM

## 2019-11-08 MED ORDER — POLYSACCHARIDE IRON COMPLEX 150 MG PO CAPS: 150.0000 mg | ORAL_CAPSULE | Freq: Every day | ORAL | 0 refills | Status: AC

## 2019-11-08 MED ORDER — PRENATAL MULTIVITAMIN CH: 1.0000 | ORAL_TABLET | Freq: Every day | ORAL | Status: AC

## 2019-11-08 MED ORDER — ACETAMINOPHEN 500 MG PO TABS: 1000.0000 mg | ORAL_TABLET | Freq: Four times a day (QID) | ORAL | 0 refills | Status: AC | PRN

## 2019-11-08 MED ORDER — OXYCODONE HCL 5 MG PO TABS: 5.0000 mg | ORAL_TABLET | Freq: Four times a day (QID) | ORAL | 0 refills | Status: AC | PRN

## 2019-11-08 MED ORDER — IBUPROFEN 800 MG PO TABS: 800.0000 mg | ORAL_TABLET | Freq: Four times a day (QID) | ORAL | 0 refills | Status: AC

## 2019-11-08 NOTE — Progress Notes (Signed)
POSTOPERATIVE DAY # 2 S/P Primary LTCS for non-reassuring fetal tracing, baby girl "[place]"   S:         Reports feeling okay. Pain controlled, Ready for discharge   Newborn breast feeding - reports going well; seeing colostrum; latching well   BP 110/70 (BP Location: Right Arm)   Pulse 75   Temp 98.2 F (36.8 C) (Oral)   Resp 16   Ht 5\' 2"  (1.575 m)   Wt 94.3 kg   LMP 02/11/2019 (Approximate)   SpO2 98%   Breastfeeding Unknown   BMI 38.02 kg/m               No results found for this or any previous visit (from the past 24 hour(s)).             Bloodtype: --/--/O POS, O POS Performed at [facility], [place] [address] [place], [place] [address]  [phone]/23 0428)  Rubella:   Immune                                            I&O: Intake/Output      12/24 0701 - 12/25 0700 12/25 0701 - 12/26 0700   P.O. 300    I.V. (mL/kg) 0 (0)    Other 0    IV Piggyback     Total Intake(mL/kg) 300 (3.2)    Urine (mL/kg/hr) 575 (0.3)    Blood     Total Output 575    Net -275           Flu and Tdap UTD             Physical Exam:             Alert and Oriented X3  Lungs: Clear and unlabored  Heart: regular rate and rhythm / no murmurs  Abdomen: soft, non-tender, mild gaseous distention, active bowel sounds in all quadrants              Fundus: firm, non-tender, U-1             Dressing: honeycomb dressing with steri-strips c/d/i              Incision:  approximated with sutures / no erythema / no ecchymosis / no drainage  Perineum: intact  Lochia: small rubra noted on pad   Extremities: +1 LE edema, no calf pain or tenderness, non-pitting upper extremity edema noted   A/P:     POD # 2 S/P Primary LTCS            Gestational hypertension on admission   - Labs WNL   - BPs WNL since delivery   - No neural s/s or evidence of PEC  ABL Anemia compounding chronic IDA    - S/p Feraheme IVPB x 1 dose, then begin Niferex 150mg  PO   - Magnesium oxide 400mg  PO daily   Pain mngmt  reviewed.  Discharge home  F/up Dr Benjie Karvonen 6 weeks

## 2019-11-08 NOTE — Lactation Note (Addendum)
This note was copied from a baby's chart. Lactation Consultation Note  Patient Name: Denise Fuentes HFSFS'E Date: 11/08/2019   Additional feeding observations still proved a LATCH score of 10 (swallows again verified by cervical auscultation). I encouraged Mom to continue with breast compression during feedings to increase frequency of swallowing when at breast (suck:swallow ratio of 1:1 noted with breast compressions).   Parents were instructed by NP about spoon-feeding expressed milk after feedings at the breast.  Mom had no questions for me.   Matthias Hughs [facility] 11/08/2019, 11:30 AM

## 2019-11-08 NOTE — Lactation Note (Signed)
This note was copied from a baby's chart. Lactation Consultation Note  Patient Name: Denise Fuentes Date: 11/08/2019   Nurse Practitioner asked me to assist Mom with expressing her milk. Mom already knew a hand expression technique. At the beginning of hand expressing on each breast, Mom "squirted" out milk & transitional milk was seen. While hand expressing, infant began cueing again, so infant was put to the breast.   I asked Mom to call for me when she is done feeding & instructed her on how to reach me.   Matthias Hughs [facility] 11/08/2019, 9:22 AM

## 2019-11-08 NOTE — Progress Notes (Signed)
CSW received and acknowledges consult for EDPS of 9.  Consult screened out due to 9 on EDPS does not warrant a CSW consult.  MOB whom scores are greater than 9/yes to question 10 on Edinburgh Postpartum Depression Screen warrants a CSW consult.   Trygg Mantz Boyd-Gilyard, MSW, LCSW Clinical Social Work [phone]  

## 2019-11-08 NOTE — Lactation Note (Signed)
This note was copied from a baby's chart. Lactation Consultation Note  Patient Name: Denise Fuentes MOLMB'E Date: 11/08/2019   Infant is 36 hrs old. When I entered room, Mom had recently latched. Mom was comfortable with latch & infant was doing very well (swallows verified by cervical auscultation). Mom says her breasts are "definitely getting more full."  Other LC notes have mentioned wide-spaced breasts, but Mom has broad bases to her breasts, which is reassuring. Mom reports that with pregnancy, her breast changes consisted of tenderness, getting stretch marks, & areolar changes. Mom feels like her breasts have gotten bigger since infant was born & has begun feeding.   Mom had questions about the Moro reflex & when to begin pumping. I suggested waiting for a couple of weeks until pumping, unless there are concerns about infant's weight gain, engorgement, or Mom & baby are separated.   Mom had no concerns & no other questions. Pacifier use discouraged for the time being.   Over the last 24 hrs, infant has had 9 voids & 5 stools. Mom reports that stools are no longer tar-like.   Matthias Hughs [facility] 11/08/2019, 8:04 AM

## 2019-11-08 NOTE — Discharge Summary (Signed)
Obstetric Discharge Summary Reason for Admission: onset of labor G1, 39.5 weeks, contractions, borderline elevated BP at admission but normal after pain controlled.  Prenatal Procedures: ultrasound, labs. Routine PNCare but missed several visits due to car ride problems  Intrapartum Procedures: Primary Low Transverse C-section for non-reassuring FHT, repetitive decelerations in active labor, remote from delivery Postpartum Procedures: IV Feraheme for chronic anemia complicated by acute blood loss at C-section Complications-Operative and Postpartum: none  CBC Latest Ref Rng & Units 11/07/2019 11/06/2019 11/06/2019  WBC 4.0 - 10.5 K/uL 16.5(H) 28.0(H) 17.7(H)  Hemoglobin 12.0 - 15.0 g/dL 8.2(L) 9.0(L) 10.7(L)  Hematocrit 36.0 - 46.0 % 24.3(L) 27.4(L) 32.2(L)  Platelets 150 - 400 K/uL 215 283 296    Physical Exam:  General: alert and cooperative Lochia: appropriate Uterine Fundus: firm Incision: no significant drainage DVT Evaluation: No evidence of DVT seen on physical exam. Negative Homan's sign.  Discharge Diagnoses: Term Pregnancy-delivered  Discharge Information: Date: 11/08/2019 Activity: pelvic rest Diet: routine Medications: PNvitamins, Iron, Oxycodone, Tylenol, Ibuprofen Condition: stable Instructions: refer to practice specific booklet Discharge to: home Follow-up Information    Azucena Fallen, MD Follow up in 6 week(s).   Specialty: Obstetrics and Gynecology Contact information: [address] [place] [address] [phone]           Newborn Data: Live born female  Birth Weight: 7 lb 3.3 oz (3270 g) APGAR: 19, 37  Newborn Delivery   Birth date/time: 11/06/2021 10:59:00 Delivery type: C-Section, Low Transverse Trial of labor: Yes C-section categorization: Primary      Home with mother.  Elveria Royals 11/08/2019, 10:46 AM

## 2019-11-18 DIAGNOSIS — Z00111 Health examination for newborn 8 to 28 days old: Secondary | ICD-10-CM | POA: Diagnosis not present

## 2020-08-03 IMAGING — DX PORTABLE CHEST - 1 VIEW
1 series · 1 of 1 positions shown · non-contrast
Comparison: None.

CLINICAL DATA: 18-year-old female with vomiting.

EXAM:
PORTABLE CHEST 1 VIEW

[chest ap]
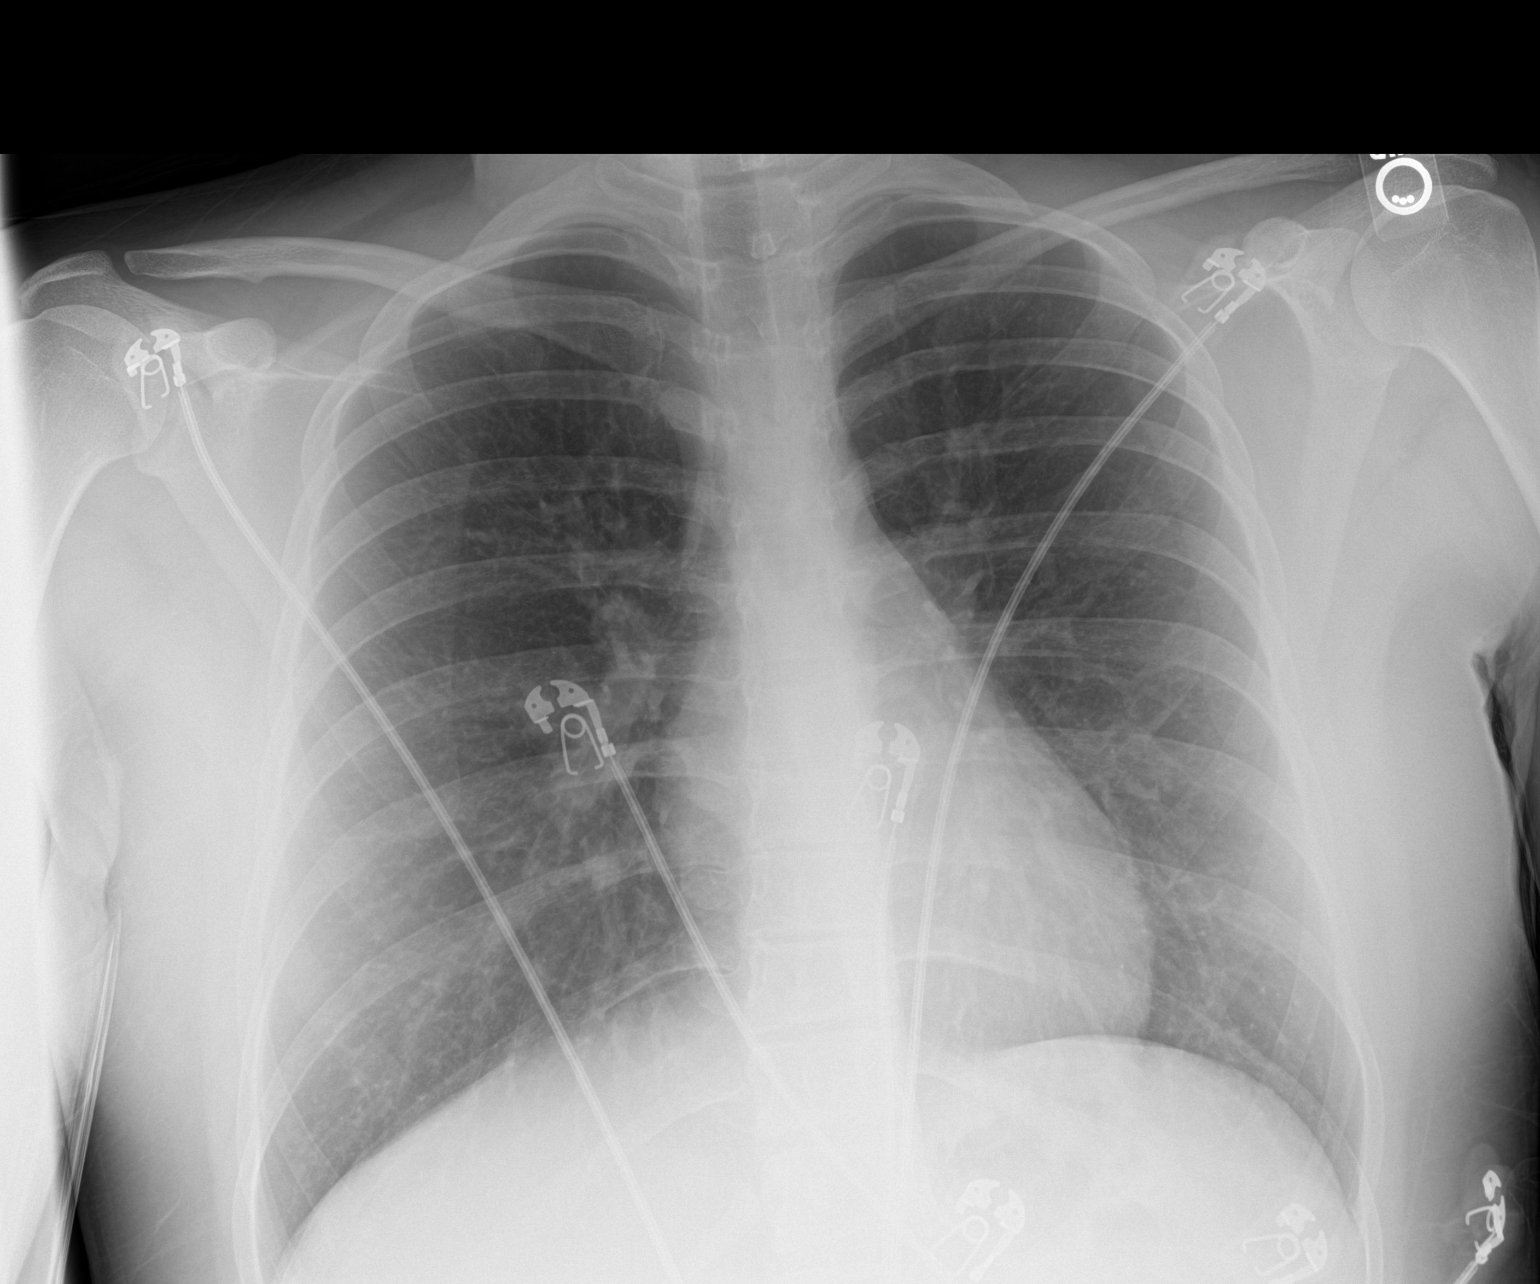

[1 of 1 positions shown; findings below may reference images not displayed]

FINDINGS: The heart size and mediastinal contours are within normal limits.
Both lungs are clear. The visualized skeletal structures are
unremarkable.
IMPRESSION: No active disease.

## 2020-08-10 IMAGING — US ULTRASOUND ABDOMEN LIMITED
1 series · 10 of 10 positions shown · non-contrast
Comparison: Ob ultrasound today reported separately.

CLINICAL DATA: 18-year-old female with abdominal pain in the 1st
trimester of pregnancy and questionable left upper quadrant palpable
mass on exam.

EXAM:
ULTRASOUND ABDOMEN LIMITED

[Series 1: ultrasound abdomen limited · 10 of 10 slices shown]
[im 1/10]
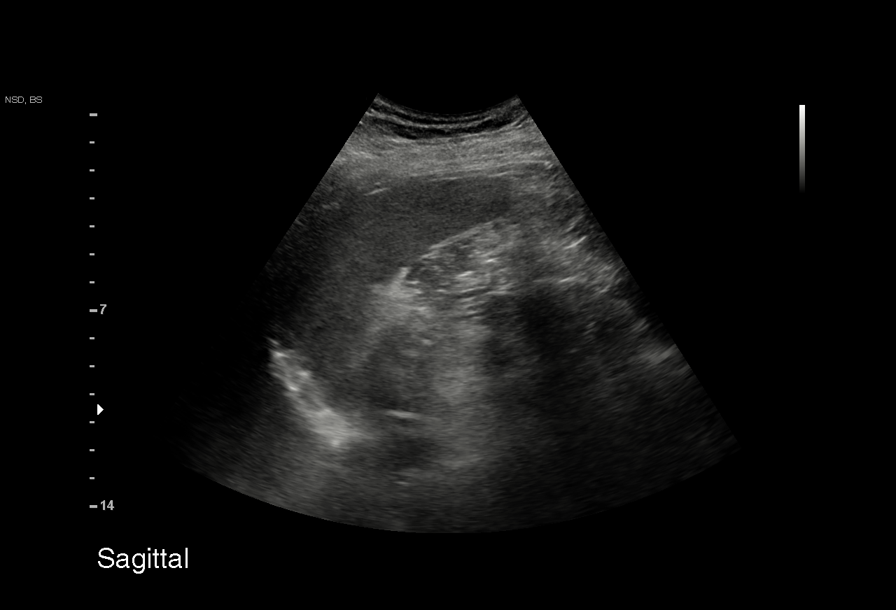
[im 2/10]
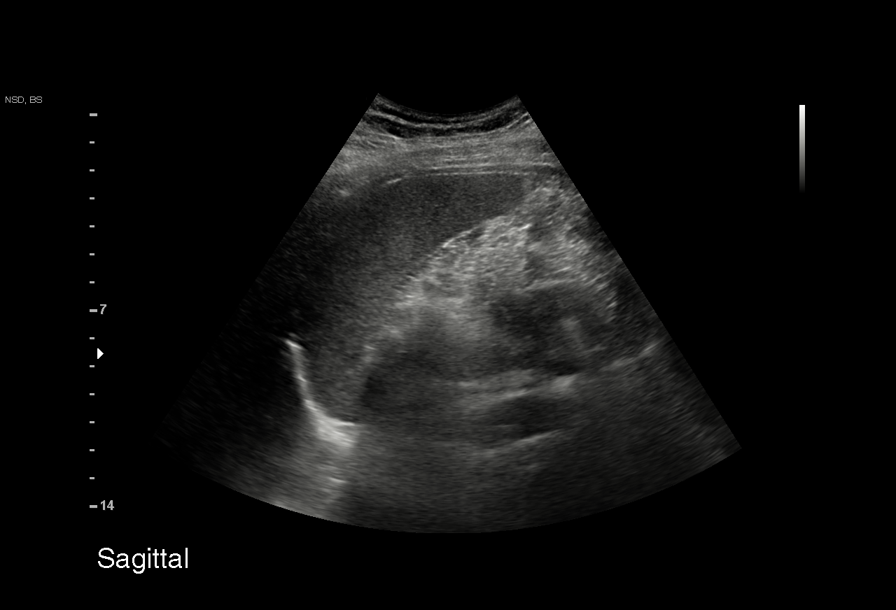
[im 3/10]
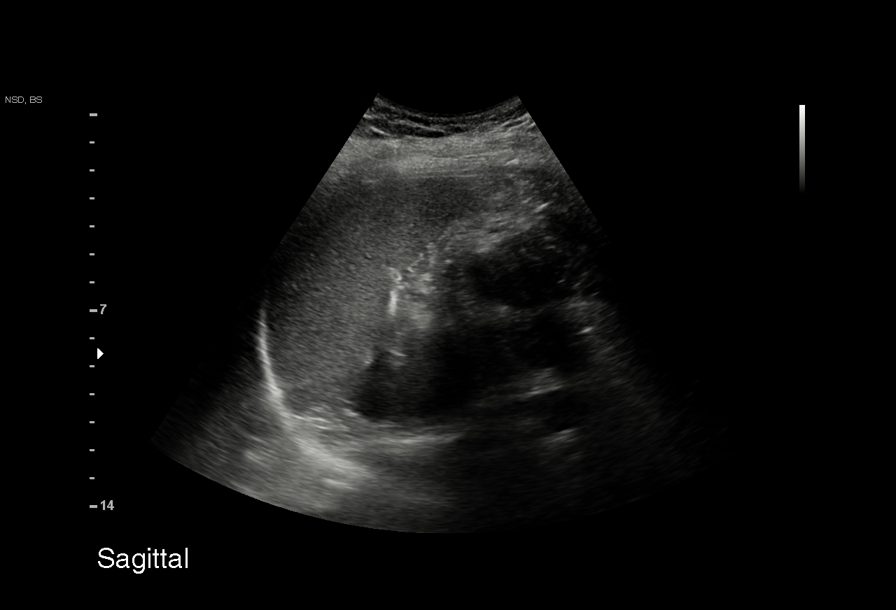
[im 4/10]
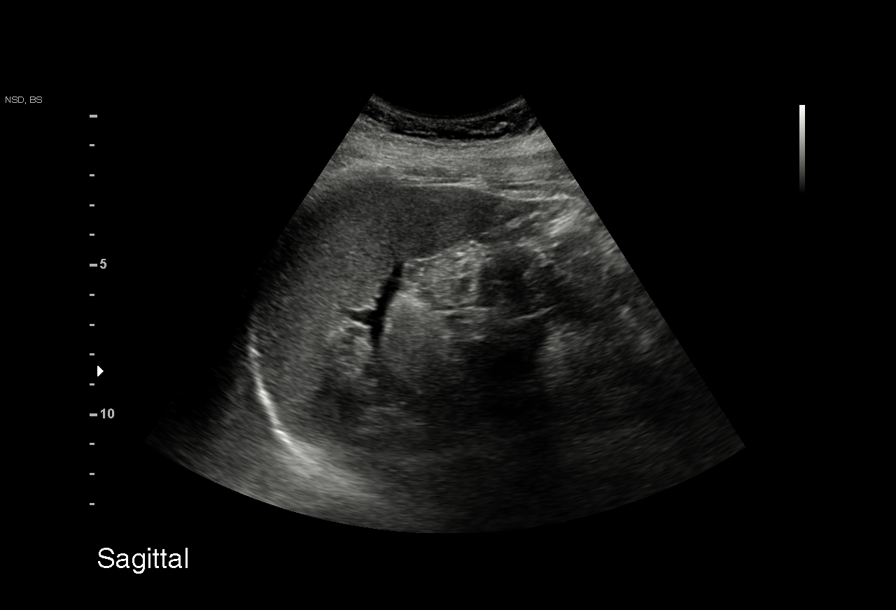
[im 5/10]
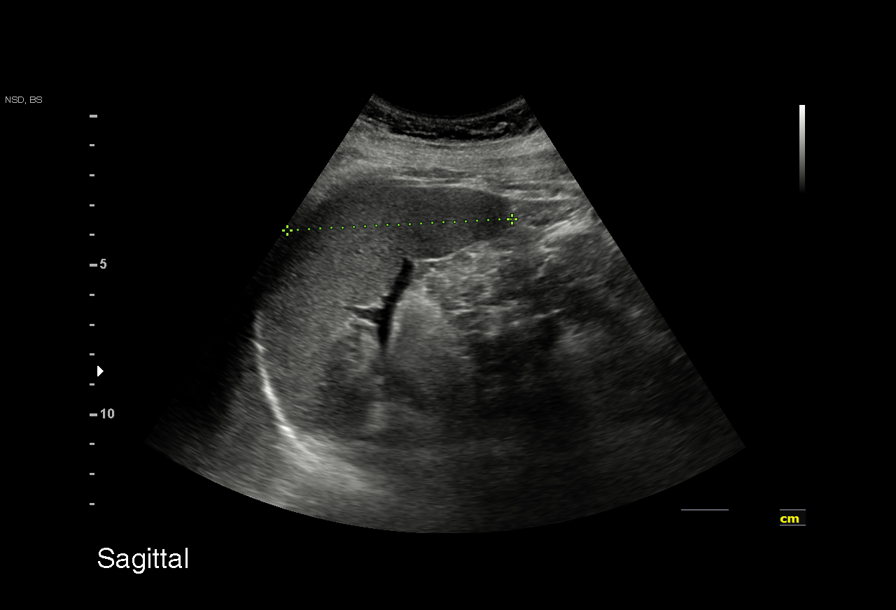
[im 6/10]
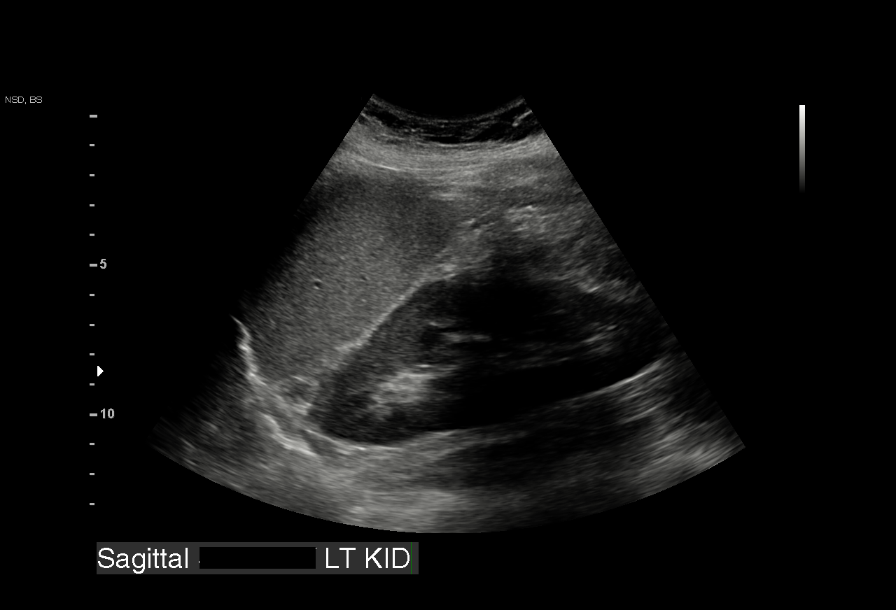
[im 7/10]
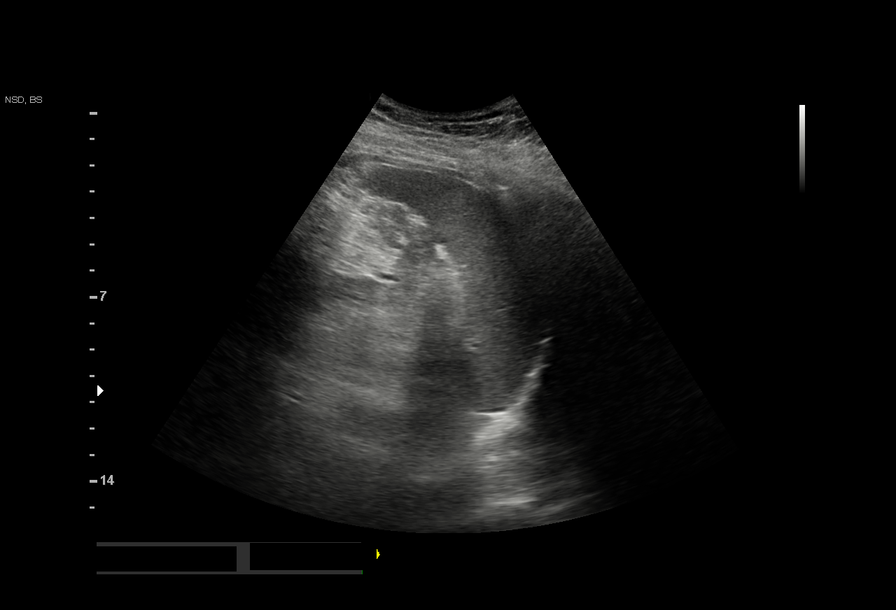
[im 8/10]
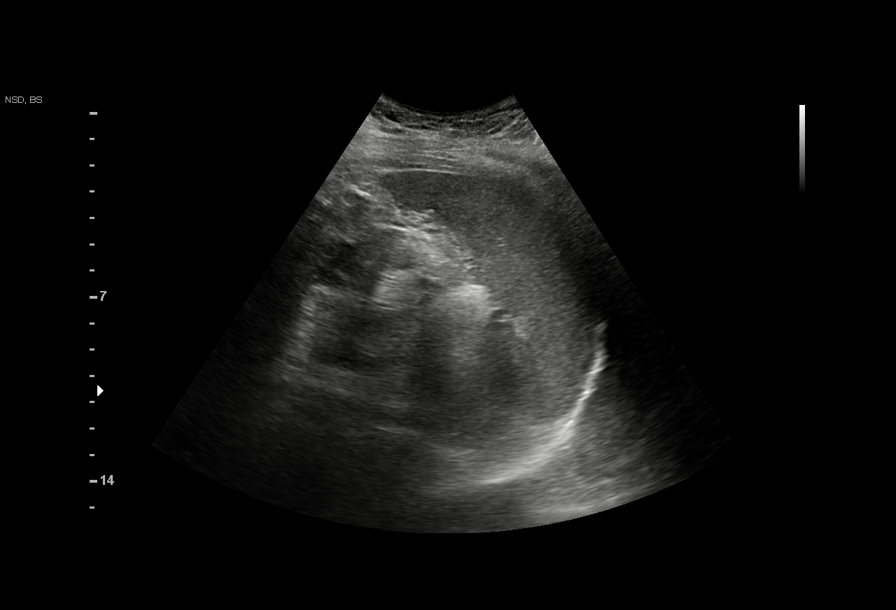
[im 9/10]
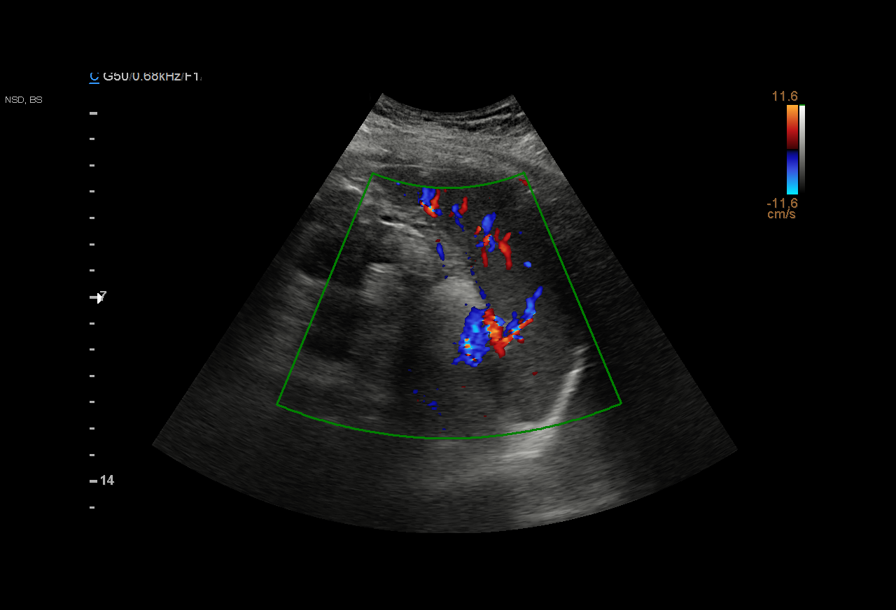
[im 10/10]
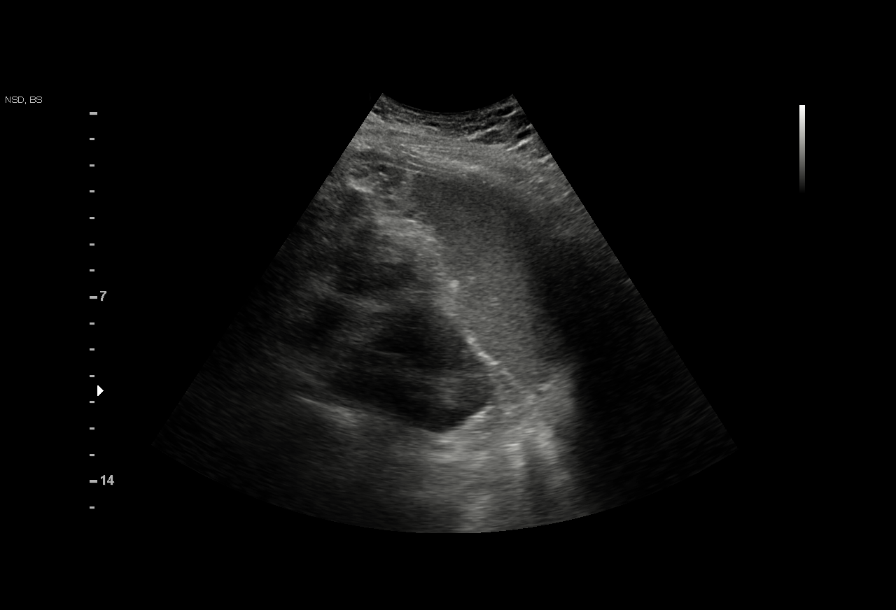

[10 of 10 positions shown; findings below may reference images not displayed]

FINDINGS: Grayscale and color Doppler images of the left upper quadrant and
spleen.

Splenic size, contour and echogenicity are within normal limits.
Splenic length is 7.5 centimeters (normal).

Normal visible left kidney (image 7). No free fluid. No left upper
quadrant abnormality.
IMPRESSION: Normal spleen with no ultrasound abnormality in the left upper
quadrant.

## 2021-08-28 ENCOUNTER — Emergency Department (HOSPITAL_COMMUNITY)
Admission: EM | Admit: 2021-08-28 | Discharge: 2021-08-28 | Disposition: A | Discharge status: Home / Self Care | Payer: Medicaid Other | Attending: Emergency Medicine | Admitting: Emergency Medicine

## 2021-08-28 ENCOUNTER — Other Ambulatory Visit: Payer: Self-pay

## 2021-08-28 DIAGNOSIS — Z87891 Personal history of nicotine dependence: Secondary | ICD-10-CM | POA: Diagnosis not present

## 2021-08-28 DIAGNOSIS — R1030 Lower abdominal pain, unspecified: Secondary | ICD-10-CM | POA: Diagnosis present

## 2021-08-28 DIAGNOSIS — R109 Unspecified abdominal pain: Secondary | ICD-10-CM

## 2021-08-28 DIAGNOSIS — G8929 Other chronic pain: Secondary | ICD-10-CM | POA: Insufficient documentation

## 2021-08-28 DIAGNOSIS — B3731 Acute candidiasis of vulva and vagina: Secondary | ICD-10-CM

## 2021-08-28 LAB — CBC WITH DIFFERENTIAL/PLATELET
Abs Immature Granulocytes: 0.02 10*3/uL (ref 0.00–0.07)
Basophils Absolute: 0 10*3/uL (ref 0.0–0.1)
Basophils Relative: 0 %
Eosinophils Absolute: 0.1 10*3/uL (ref 0.0–0.5)
Eosinophils Relative: 1 %
HCT: 40.6 % (ref 36.0–46.0)
Hemoglobin: 13.7 g/dL (ref 12.0–15.0)
Immature Granulocytes: 0 %
Lymphocytes Relative: 17 %
Lymphs Abs: 1.9 10*3/uL (ref 0.7–4.0)
MCH: 28.7 pg (ref 26.0–34.0)
MCHC: 33.7 g/dL (ref 30.0–36.0)
MCV: 85.1 fL (ref 80.0–100.0)
Monocytes Absolute: 0.5 10*3/uL (ref 0.1–1.0)
Monocytes Relative: 4 %
Neutro Abs: 8.8 10*3/uL — ABNORMAL HIGH (ref 1.7–7.7)
Neutrophils Relative %: 78 %
Platelets: 286 10*3/uL (ref 150–400)
RBC: 4.77 MIL/uL (ref 3.87–5.11)
RDW: 12.5 % (ref 11.5–15.5)
WBC: 11.4 10*3/uL — ABNORMAL HIGH (ref 4.0–10.5)
nRBC: 0 % (ref 0.0–0.2)

## 2021-08-28 LAB — URINALYSIS, ROUTINE W REFLEX MICROSCOPIC
Bilirubin Urine: NEGATIVE
Glucose, UA: NEGATIVE mg/dL
Ketones, ur: NEGATIVE mg/dL
Nitrite: NEGATIVE
Protein, ur: NEGATIVE mg/dL
Specific Gravity, Urine: 1.019 (ref 1.005–1.030)
pH: 5 (ref 5.0–8.0)

## 2021-08-28 LAB — COMPREHENSIVE METABOLIC PANEL
ALT: 18 U/L (ref 0–44)
AST: 19 U/L (ref 15–41)
Albumin: 4.5 g/dL (ref 3.5–5.0)
Alkaline Phosphatase: 79 U/L (ref 38–126)
Anion gap: 9 (ref 5–15)
BUN: 9 mg/dL (ref 6–20)
CO2: 23 mmol/L (ref 22–32)
Calcium: 9.4 mg/dL (ref 8.9–10.3)
Chloride: 104 mmol/L (ref 98–111)
Creatinine, Ser: 0.56 mg/dL (ref 0.44–1.00)
GFR, Estimated: >60 mL/min (ref >60)
Glucose, Bld: 104 mg/dL — ABNORMAL HIGH (ref 70–99)
Potassium: 3.6 mmol/L (ref 3.5–5.1)
Sodium: 136 mmol/L (ref 135–145)
Total Bilirubin: 0.7 mg/dL (ref 0.3–1.2)
Total Protein: 8.2 g/dL — ABNORMAL HIGH (ref 6.5–8.1)

## 2021-08-28 LAB — I-STAT BETA HCG BLOOD, ED (MC, WL, AP ONLY): I-stat hCG, quantitative: <5 m[IU]/mL (ref <5)

## 2021-08-28 MED ORDER — FLUCONAZOLE 150 MG PO TABS
150.0000 mg | ORAL_TABLET | Freq: Once | ORAL | Status: AC
  Administered 2021-08-28: 150 mg via ORAL
  Filled 2021-08-28: qty 1

## 2021-08-28 MED ORDER — DICYCLOMINE HCL 10 MG/5ML PO SOLN
10.0000 mg | Freq: Once | ORAL | Status: AC
  Administered 2021-08-28: 10 mg via ORAL
  Filled 2021-08-28: qty 5

## 2021-08-28 MED ORDER — LACTATED RINGERS IV SOLN: INTRAVENOUS | Status: DC

## 2021-08-28 MED ORDER — LACTATED RINGERS IV BOLUS
1000.0000 mL | Freq: Once | INTRAVENOUS | Status: AC
  Administered 2021-08-28: 1000 mL via INTRAVENOUS

## 2021-08-28 MED ORDER — ALUM & MAG HYDROXIDE-SIMETH 200-200-20 MG/5ML PO SUSP
30.0000 mL | Freq: Once | ORAL | Status: AC
  Administered 2021-08-28: 30 mL via ORAL
  Filled 2021-08-28: qty 30

## 2021-08-28 MED ORDER — OXYCODONE-ACETAMINOPHEN 5-325 MG PO TABS
1.0000 | ORAL_TABLET | Freq: Once | ORAL | Status: AC
  Administered 2021-08-28: 1 via ORAL
  Filled 2021-08-28: qty 1

## 2021-08-28 MED ORDER — SUCRALFATE 1 G PO TABS
1.0000 g | ORAL_TABLET | Freq: Once | ORAL | Status: AC
  Administered 2021-08-28: 1 g via ORAL
  Filled 2021-08-28: qty 1

## 2021-08-28 MED ORDER — LIDOCAINE VISCOUS HCL 2 % MT SOLN
15.0000 mL | Freq: Once | OROMUCOSAL | Status: AC
  Administered 2021-08-28: 15 mL via ORAL
  Filled 2021-08-28: qty 15

## 2021-08-28 NOTE — ED Triage Notes (Signed)
Pt endorses lower abdominal pain for the past 2 years. States it got better while pregnant but now has returned.  Feels like it is underneath her C-section scar.  Pt has no n/v/d but does say she gets frequent UTIs.  Pt endorses that the pain radiates to her back as well.  No known fevers.

## 2021-08-28 NOTE — ED Provider Notes (Signed)
Emergency Medicine Provider Triage Evaluation Note  Denise Fuentes , a 20 y.o. female  was evaluated in triage.  Pt complains of lower abdominal pain that is chronic in nature but has worsened today.  Review of Systems  Positive: Abd pain Negative: Fevers, nvd, urinary sxs  Physical Exam  BP (!) 113/98 (BP Location: Left Arm)   Pulse 85   Temp 97.8 F (36.6 C) (Oral)   Resp 16   Ht 5' (1.524 m)   Wt 81.6 kg   SpO2 96%   BMI 35.15 kg/m  Gen:   Awake, no distress   Resp:  Normal effort  MSK:   Moves extremities without difficulty  Other:  tearful  Medical Decision Making  Medically screening exam initiated at 4:31 PM.  Appropriate orders placed.  Roselene Daffern was informed that the remainder of the evaluation will be completed by another provider, this initial triage assessment does not replace that evaluation, and the importance of remaining in the ED until their evaluation is complete.     Rayne Du 08/28/21 1632    Lorre Nick, MD 08/28/21 2251

## 2021-08-28 NOTE — ED Provider Notes (Signed)
[facility]-EMERGENCY DEPT Provider Note   CSN: [id number] Arrival date & time: 08/28/21  1619     History Chief Complaint  Patient presents with   Abdominal Pain    Denise Fuentes is a 20 y.o. female.  20 year old female presents with worsening chronic lower abdominal discomfort.  States that started yesterday and describes it as sharp stabbing pain.  No associated fevers, nausea, vomiting, diarrhea.  No vaginal bleeding or discharge.  Does have a history of UTI but states that this does not feel like it.  Denies any constipation.  No treatment use prior to arrival has not been seen for this in the past by specialist      Past Medical History:  Diagnosis Date   Kidney stones     Patient Active Problem List   Diagnosis Date Noted   Gestational hypertension w/o significant proteinuria in 3rd trimester 11/06/2019   Postpartum care following cesarean delivery (12/23) 11/06/2019   Chronic pelvic pain in female 11/01/2018    Past Surgical History:  Procedure Laterality Date   CESAREAN SECTION N/A 11/06/2019   Procedure: CESAREAN SECTION;  Surgeon: Maxie Better, MD;  Location: [facility] LD ORS;  Service: Obstetrics;  Laterality: N/A;   TONSILLECTOMY       OB History     Gravida  1   Para  1   Term  1   Preterm      AB      Living  1      SAB      IAB      Ectopic      Multiple  0   Live Births  1           Family History  Problem Relation Age of Onset   Cancer Father    Cancer Maternal Grandmother    Healthy Mother     Social History   Tobacco Use   Smoking status: Former    Packs/day: 0.00    Types: Cigarettes   Smokeless tobacco: Never  Vaping Use   Vaping Use: Never used  Substance Use Topics   Alcohol use: Not Currently   Drug use: Never    Home Medications Prior to Admission medications   Medication Sig Start Date End Date Taking? Authorizing Provider  acetaminophen (TYLENOL) 500 MG tablet Take 2  tablets (1,000 mg total) by mouth every 6 (six) hours as needed for moderate pain. 11/08/19   Shea Evans, MD  ibuprofen (ADVIL) 800 MG tablet Take 1 tablet (800 mg total) by mouth every 6 (six) hours. 11/08/19   Shea Evans, MD  iron polysaccharides (NIFEREX) 150 MG capsule Take 1 capsule (150 mg total) by mouth daily. 11/08/19   Shea Evans, MD  Prenatal Vit-Fe Fumarate-FA (PRENATAL MULTIVITAMIN) TABS tablet Take 1 tablet by mouth daily at 12 noon. 11/08/19   Shea Evans, MD    Allergies    Amoxapine and related, Amoxicillin, and Bee venom  Review of Systems   Review of Systems  All other systems reviewed and are negative.  Physical Exam Updated Vital Signs BP (!) 113/98 (BP Location: Left Arm)   Pulse 85   Temp 97.8 F (36.6 C) (Oral)   Resp 16   Ht 1.524 m (5')   Wt 81.6 kg   SpO2 96%   BMI 35.15 kg/m   Physical Exam Vitals and nursing note reviewed.  Constitutional:      General: She is not in acute distress.    Appearance: Normal  appearance. She is well-developed. She is not toxic-appearing.  HENT:     Head: Normocephalic and atraumatic.  Eyes:     General: Lids are normal.     Conjunctiva/sclera: Conjunctivae normal.     Pupils: Pupils are equal, round, and reactive to light.  Neck:     Thyroid: No thyroid mass.     Trachea: No tracheal deviation.  Cardiovascular:     Rate and Rhythm: Normal rate and regular rhythm.     Heart sounds: Normal heart sounds. No murmur heard.   No gallop.  Pulmonary:     Effort: Pulmonary effort is normal. No respiratory distress.     Breath sounds: Normal breath sounds. No stridor. No decreased breath sounds, wheezing, rhonchi or rales.  Abdominal:     General: There is no distension.     Palpations: Abdomen is soft.     Tenderness: There is no abdominal tenderness. There is no rebound.  Musculoskeletal:        General: No tenderness. Normal range of motion.     Cervical back: Normal range of motion and neck supple.   Skin:    General: Skin is warm and dry.     Findings: No abrasion or rash.  Neurological:     Mental Status: She is alert and oriented to person, place, and time. Mental status is at baseline.     GCS: GCS eye subscore is 4. GCS verbal subscore is 5. GCS motor subscore is 6.     Cranial Nerves: Cranial nerves are intact. No cranial nerve deficit.     Sensory: No sensory deficit.     Motor: Motor function is intact.  Psychiatric:        Attention and Perception: Attention normal.        Speech: Speech normal.        Behavior: Behavior normal.    ED Results / Procedures / Treatments   Labs (all labs ordered are listed, but only abnormal results are displayed) Labs Reviewed  CBC WITH DIFFERENTIAL/PLATELET  COMPREHENSIVE METABOLIC PANEL  URINALYSIS, ROUTINE W REFLEX MICROSCOPIC  I-STAT BETA HCG BLOOD, ED ([facility], [facility], [facility] ONLY)    EKG None  Radiology No results found.  Procedures Procedures   Medications Ordered in ED Medications - No data to display  ED Course  I have reviewed the triage vital signs and the nursing notes.  Pertinent labs & imaging results that were available during my care of the patient were reviewed by me and considered in my medical decision making (see chart for details).    MDM Rules/Calculators/A&P                          Suspect that urine is contaminated.  Patient also has evidence of yeast and will place on Diflucan for this.  Final Clinical Impression(s) / ED Diagnoses Final diagnoses:  None    Rx / DC Orders ED Discharge Orders     None        Lorre Nick, MD 08/28/21 2116

## 2021-10-06 ENCOUNTER — Ambulatory Visit: Payer: Medicaid Other | Admitting: Nurse Practitioner

## 2023-02-09 ENCOUNTER — Encounter: Payer: Medicaid Other | Admitting: Obstetrics & Gynecology

## 2023-02-24 ENCOUNTER — Other Ambulatory Visit: Payer: Self-pay

## 2023-02-24 ENCOUNTER — Emergency Department (HOSPITAL_COMMUNITY)
Admission: EM | Admit: 2023-02-24 | Discharge: 2023-02-24 | Disposition: A | Discharge status: Home / Self Care | Payer: Medicaid Other | Attending: Emergency Medicine | Admitting: Emergency Medicine

## 2023-02-24 ENCOUNTER — Encounter (HOSPITAL_COMMUNITY): Payer: Self-pay

## 2023-02-24 DIAGNOSIS — D72829 Elevated white blood cell count, unspecified: Secondary | ICD-10-CM | POA: Insufficient documentation

## 2023-02-24 DIAGNOSIS — Z3A12 12 weeks gestation of pregnancy: Secondary | ICD-10-CM | POA: Diagnosis not present

## 2023-02-24 DIAGNOSIS — O219 Vomiting of pregnancy, unspecified: Secondary | ICD-10-CM | POA: Diagnosis present

## 2023-02-24 LAB — CBC WITH DIFFERENTIAL/PLATELET
Abs Immature Granulocytes: 0.04 10*3/uL (ref 0.00–0.07)
Basophils Absolute: 0 10*3/uL (ref 0.0–0.1)
Basophils Relative: 0 %
Eosinophils Absolute: 0 10*3/uL (ref 0.0–0.5)
Eosinophils Relative: 0 %
HCT: 40.2 % (ref 36.0–46.0)
Hemoglobin: 13.9 g/dL (ref 12.0–15.0)
Immature Granulocytes: 0 %
Lymphocytes Relative: 13 %
Lymphs Abs: 1.9 10*3/uL (ref 0.7–4.0)
MCH: 27.8 pg (ref 26.0–34.0)
MCHC: 34.6 g/dL (ref 30.0–36.0)
MCV: 80.4 fL (ref 80.0–100.0)
Monocytes Absolute: 0.6 10*3/uL (ref 0.1–1.0)
Monocytes Relative: 4 %
Neutro Abs: 11.4 10*3/uL — ABNORMAL HIGH (ref 1.7–7.7)
Neutrophils Relative %: 83 %
Platelets: 320 10*3/uL (ref 150–400)
RBC: 5 MIL/uL (ref 3.87–5.11)
RDW: 12.7 % (ref 11.5–15.5)
WBC: 14 10*3/uL — ABNORMAL HIGH (ref 4.0–10.5)
nRBC: 0 % (ref 0.0–0.2)

## 2023-02-24 LAB — URINALYSIS, ROUTINE W REFLEX MICROSCOPIC
Bacteria, UA: NONE SEEN
Bilirubin Urine: NEGATIVE
Glucose, UA: NEGATIVE mg/dL
Hgb urine dipstick: NEGATIVE
Ketones, ur: 5 mg/dL — AB
Leukocytes,Ua: NEGATIVE
Nitrite: NEGATIVE
Protein, ur: NEGATIVE mg/dL
Specific Gravity, Urine: 1.026 (ref 1.005–1.030)
pH: 5 (ref 5.0–8.0)

## 2023-02-24 LAB — COMPREHENSIVE METABOLIC PANEL
ALT: 13 U/L (ref 0–44)
AST: 21 U/L (ref 15–41)
Albumin: 4.2 g/dL (ref 3.5–5.0)
Alkaline Phosphatase: 59 U/L (ref 38–126)
Anion gap: 10 (ref 5–15)
BUN: 7 mg/dL (ref 6–20)
CO2: 22 mmol/L (ref 22–32)
Calcium: 9.1 mg/dL (ref 8.9–10.3)
Chloride: 100 mmol/L (ref 98–111)
Creatinine, Ser: 0.46 mg/dL (ref 0.44–1.00)
GFR, Estimated: >60 mL/min (ref >60)
Glucose, Bld: 104 mg/dL — ABNORMAL HIGH (ref 70–99)
Potassium: 3.2 mmol/L — ABNORMAL LOW (ref 3.5–5.1)
Sodium: 132 mmol/L — ABNORMAL LOW (ref 135–145)
Total Bilirubin: 0.2 mg/dL — ABNORMAL LOW (ref 0.3–1.2)
Total Protein: 8.5 g/dL — ABNORMAL HIGH (ref 6.5–8.1)

## 2023-02-24 MED ORDER — SODIUM CHLORIDE 0.9 % IV SOLN
12.5000 mg | Freq: Four times a day (QID) | INTRAVENOUS | Status: DC | PRN
  Administered 2023-02-24: 12.5 mg via INTRAVENOUS
  Filled 2023-02-24: qty 12.5

## 2023-02-24 MED ORDER — SODIUM CHLORIDE 0.9 % IV BOLUS
2000.0000 mL | Freq: Once | INTRAVENOUS | Status: AC
  Administered 2023-02-24: 2000 mL via INTRAVENOUS

## 2023-02-24 MED ORDER — PROMETHAZINE HCL 12.5 MG PO TABS: 25.0000 mg | ORAL_TABLET | Freq: Four times a day (QID) | ORAL | 0 refills | Status: AC | PRN

## 2023-02-24 NOTE — Discharge Instructions (Signed)
I have written for Phenergan.  Take as needed for vomiting.  I would recommend starting the Diclegis provided to you by your OB/GYN first.  Use the Phenergan as backup if still persistent vomiting.  Bland foods.  Push liquids.  Follow-up with OB/GYN or go to labor and delivery if you have worsening symptoms

## 2023-02-24 NOTE — ED Notes (Signed)
Pt eating chick-fil-a and drinking Pepsi for PO challenge.

## 2023-02-24 NOTE — ED Provider Triage Note (Signed)
Emergency Medicine Provider Triage Evaluation Note  Denise Fuentes , a 22 y.o. female  was evaluated in triage.  Pt complains of N/V. 3 month Preg. [name], MD in [place]. Given Zofran on Monday., Still with emesis. Prior US with IUP. No pain, bleeding, discharge.  Review of Systems  Positive: N/V Negative: Abd pain  Physical Exam  BP 127/83 (BP Location: Left Arm)   Pulse (!) 110   Temp (!) 97.5 F (36.4 C) (Oral)   Resp 16   Ht 5' (1.524 m)   Wt 89.4 kg   SpO2 97%   BMI 38.47 kg/m  Gen:   Awake, no distress   Resp:  Normal effort  MSK:   Moves extremities without difficulty  Other:    Medical Decision Making  Medically screening exam initiated at 3:58 PM.  Appropriate orders placed.  [name] was informed that the remainder of the evaluation will be completed by another provider, this initial triage assessment does not replace that evaluation, and the importance of remaining in the ED until their evaluation is complete.  N/V in preg   Hendricks Schwandt A, PA-C 02/24/23 1559

## 2023-02-24 NOTE — ED Triage Notes (Signed)
[redacted]w[redacted]d pregnant, denies pain, bleeding or any issues with pregnancy. Pt has been nauseous and vomiting for the last few days.  No relief with zofran.  G2P1

## 2023-02-24 NOTE — ED Provider Notes (Signed)
[place] EMERGENCY DEPARTMENT AT [facility] Provider Note   CSN: [id number] Arrival date & time: 02/24/23  1541    History  Chief Complaint  Patient presents with   Emesis During Pregnancy    Denise Fuentes is a 22 y.o. female G1P0 approximately [redacted]w[redacted]d preg here for evaluation of persistent N/V. Given Zofran by Obgyn without relief. Followed by [place] Dr. [name]. No abd pain, bleeding, vag discharge. No UTI Sx. Recent US with IUP. No complications with preg currently  HPI     Home Medications Prior to Admission medications   Medication Sig Start Date End Date Taking? Authorizing Provider  promethazine (PHENERGAN) 12.5 MG tablet Take 2 tablets (25 mg total) by mouth every 6 (six) hours as needed for nausea or vomiting. 02/24/23  Yes Nic Lampe A, PA-C  acetaminophen (TYLENOL) 500 MG tablet Take 2 tablets (1,000 mg total) by mouth every 6 (six) hours as needed for moderate pain. 11/08/19   Shea Evans, MD  ibuprofen (ADVIL) 800 MG tablet Take 1 tablet (800 mg total) by mouth every 6 (six) hours. 11/08/19   Shea Evans, MD  iron polysaccharides (NIFEREX) 150 MG capsule Take 1 capsule (150 mg total) by mouth daily. 11/08/19   Shea Evans, MD  Prenatal Vit-Fe Fumarate-FA (PRENATAL MULTIVITAMIN) TABS tablet Take 1 tablet by mouth daily at 12 noon. 11/08/19   Shea Evans, MD      Allergies    Amoxapine and related, Amoxicillin, Penicillins, and Bee venom    Review of Systems   Review of Systems  Constitutional: Negative.   HENT: Negative.    Respiratory: Negative.    Cardiovascular: Negative.   Gastrointestinal:  Positive for nausea and vomiting. Negative for abdominal distention, abdominal pain, anal bleeding, blood in stool, constipation, diarrhea and rectal pain.  Genitourinary: Negative.   Musculoskeletal: Negative.   Skin: Negative.   Neurological: Negative.   All other systems reviewed and are negative.   Physical Exam Updated Vital Signs BP  111/60   Pulse 86   Temp 98.5 F (36.9 C) (Oral)   Resp 16   Ht 5' (1.524 m)   Wt 89.4 kg   SpO2 98%   BMI 38.47 kg/m  Physical Exam Vitals and nursing note reviewed.  Constitutional:      General: She is not in acute distress.    Appearance: She is well-developed. She is not ill-appearing or diaphoretic.  HENT:     Head: Atraumatic.  Eyes:     Pupils: Pupils are equal, round, and reactive to light.  Cardiovascular:     Rate and Rhythm: Normal rate and regular rhythm.  Pulmonary:     Effort: Pulmonary effort is normal. No respiratory distress.     Breath sounds: Normal breath sounds.  Abdominal:     General: Bowel sounds are normal. There is no distension.     Palpations: Abdomen is soft.  Musculoskeletal:        General: Normal range of motion.     Cervical back: Normal range of motion and neck supple.  Skin:    General: Skin is warm and dry.     Capillary Refill: Capillary refill takes less than 2 seconds.  Neurological:     General: No focal deficit present.     Mental Status: She is alert and oriented to person, place, and time.  Psychiatric:        Mood and Affect: Mood normal.     ED Results / Procedures / Treatments  Labs (all labs ordered are listed, but only abnormal results are displayed) Labs Reviewed  CBC WITH DIFFERENTIAL/PLATELET - Abnormal; Notable for the following components:      Result Value   WBC 14.0 (*)    Neutro Abs 11.4 (*)    All other components within normal limits  COMPREHENSIVE METABOLIC PANEL - Abnormal; Notable for the following components:   Sodium 132 (*)    Potassium 3.2 (*)    Glucose, Bld 104 (*)    Total Protein 8.5 (*)    Total Bilirubin 0.2 (*)    All other components within normal limits  URINALYSIS, ROUTINE W REFLEX MICROSCOPIC    EKG None  Radiology No results found.  Procedures Procedures    Medications Ordered in ED Medications  promethazine (PHENERGAN) 12.5 mg in sodium chloride 0.9 % 50 mL IVPB (0  mg Intravenous Stopped 02/24/23 1926)  sodium chloride 0.9 % bolus 2,000 mL (0 mLs Intravenous Stopped 02/24/23 1926)   ED Course/ Medical Decision Making/ A&P    G1P0 [redacted]w[redacted]d preg here for N/V. Trialed Zofran by Obgyn without relief. No abd pain, vag bleeding, discharge. Benign abd exam. No UTI sx. Mild low BP. Will plan on recheck labs, IVF, antiemetic. And reassess.  Labs personally viewed and interpreted:  CBC leukocytosis, suspect likely due to dehydration CMP sodium 132, potassium 3.2  Patient received 2 L IV fluids as well as Phenergan.  On reassessment she feels significantly better.  She is able to tolerate p.o. intake.  I discussed with patient starting the diplegia's prescription that was provided to her by her OB/GYN.  Will switch her Zofran to Phenergan for breakthrough vomiting.  I encourage close follow-up with OB/GYN outpatient or return for new or worsening symptoms.  The patient has been appropriately medically screened and/or stabilized in the ED. I have low suspicion for any other emergent medical condition which would require further screening, evaluation or treatment in the ED or require inpatient management.  Patient is hemodynamically stable and in no acute distress.  Patient able to ambulate in department prior to ED.  Evaluation does not show acute pathology that would require ongoing or additional emergent interventions while in the emergency department or further inpatient treatment.  I have discussed the diagnosis with the patient and answered all questions.  Pain is been managed while in the emergency department and patient has no further complaints prior to discharge.  Patient is comfortable with plan discussed in room and is stable for discharge at this time.  I have discussed strict return precautions for returning to the emergency department.  Patient was encouraged to follow-up with PCP/specialist refer to at discharge.                            Medical Decision  Making Amount and/or Complexity of Data Reviewed Independent Historian: spouse External Data Reviewed: labs and notes. Labs: ordered. Decision-making details documented in ED Course.  Risk OTC drugs. Prescription drug management. Parenteral controlled substances. Decision regarding hospitalization. Diagnosis or treatment significantly limited by social determinants of health.           Final Clinical Impression(s) / ED Diagnoses Final diagnoses:  Nausea/vomiting in pregnancy    Rx / DC Orders ED Discharge Orders          Ordered    promethazine (PHENERGAN) 12.5 MG tablet  Every 6 hours PRN        02/24/23 2103  Seline Enzor A, PA-C 02/24/23 2111    Rozelle Logan, DO 02/24/23 2309
